# Patient Record
Sex: Male | Born: 1945 | Race: White | Hispanic: No | Marital: Married | Smoking: Former smoker
Health system: Southern US, Community
[De-identification: ages and names within clinical notes are randomized; demographics above are authoritative.]

## PROBLEM LIST (undated history)

## (undated) DIAGNOSIS — G459 Transient cerebral ischemic attack, unspecified: Secondary | ICD-10-CM

## (undated) DIAGNOSIS — I639 Cerebral infarction, unspecified: Secondary | ICD-10-CM

## (undated) DIAGNOSIS — J90 Pleural effusion, not elsewhere classified: Secondary | ICD-10-CM

## (undated) DIAGNOSIS — C8190 Hodgkin lymphoma, unspecified, unspecified site: Secondary | ICD-10-CM

## (undated) DIAGNOSIS — I519 Heart disease, unspecified: Secondary | ICD-10-CM

## (undated) DIAGNOSIS — I059 Rheumatic mitral valve disease, unspecified: Secondary | ICD-10-CM

## (undated) HISTORY — DX: Hodgkin lymphoma, unspecified, unspecified site: C81.90

## (undated) HISTORY — PX: FOOT SURGERY: SHX648

## (undated) HISTORY — DX: Cerebral infarction, unspecified: I63.9

## (undated) HISTORY — PX: SPINE SURGERY: SHX786

## (undated) HISTORY — DX: Pleural effusion, not elsewhere classified: J90

## (undated) HISTORY — DX: Transient cerebral ischemic attack, unspecified: G45.9

## (undated) HISTORY — DX: Heart disease, unspecified: I51.9

## (undated) HISTORY — DX: Rheumatic mitral valve disease, unspecified: I05.9

---

## 2002-06-17 ENCOUNTER — Ambulatory Visit (HOSPITAL_COMMUNITY): Admission: RE | Admit: 2002-06-17 | Discharge: 2002-06-17 | Payer: Self-pay | Admitting: Neurosurgery

## 2002-06-17 ENCOUNTER — Encounter: Payer: Self-pay | Admitting: Neurosurgery

## 2006-05-22 ENCOUNTER — Ambulatory Visit: Payer: Self-pay | Admitting: Unknown Physician Specialty

## 2008-03-21 DIAGNOSIS — D229 Melanocytic nevi, unspecified: Secondary | ICD-10-CM

## 2008-03-21 HISTORY — DX: Melanocytic nevi, unspecified: D22.9

## 2008-08-12 ENCOUNTER — Ambulatory Visit: Payer: Self-pay | Admitting: Family Medicine

## 2009-07-19 ENCOUNTER — Ambulatory Visit: Payer: Self-pay | Admitting: Unknown Physician Specialty

## 2009-07-19 LAB — HM COLONOSCOPY

## 2010-04-29 ENCOUNTER — Encounter: Payer: Self-pay | Admitting: Specialist

## 2011-08-07 ENCOUNTER — Ambulatory Visit: Payer: Self-pay | Admitting: Oncology

## 2011-08-15 ENCOUNTER — Ambulatory Visit: Payer: Self-pay | Admitting: Family Medicine

## 2011-08-16 ENCOUNTER — Encounter (INDEPENDENT_AMBULATORY_CARE_PROVIDER_SITE_OTHER): Payer: Medicare Other | Admitting: Ophthalmology

## 2011-08-16 ENCOUNTER — Emergency Department: Payer: Self-pay | Admitting: Emergency Medicine

## 2011-08-16 DIAGNOSIS — H34239 Retinal artery branch occlusion, unspecified eye: Secondary | ICD-10-CM

## 2011-08-16 DIAGNOSIS — I10 Essential (primary) hypertension: Secondary | ICD-10-CM

## 2011-08-16 DIAGNOSIS — H2510 Age-related nuclear cataract, unspecified eye: Secondary | ICD-10-CM

## 2011-08-16 DIAGNOSIS — H35039 Hypertensive retinopathy, unspecified eye: Secondary | ICD-10-CM

## 2011-08-16 DIAGNOSIS — H43819 Vitreous degeneration, unspecified eye: Secondary | ICD-10-CM

## 2011-08-19 ENCOUNTER — Inpatient Hospital Stay: Payer: Self-pay | Admitting: Cardiology

## 2011-08-19 LAB — CBC WITH DIFFERENTIAL/PLATELET
Basophil #: 0.1 10*3/uL (ref 0.0–0.1)
Basophil %: 0.8 %
Eosinophil #: 0.1 10*3/uL (ref 0.0–0.7)
Eosinophil %: 1.9 %
MCV: 94 fL (ref 80–100)
Monocyte #: 0.7 x10 3/mm (ref 0.2–1.0)
Neutrophil #: 5.1 10*3/uL (ref 1.4–6.5)
Neutrophil %: 67.2 %
Platelet: 223 10*3/uL (ref 150–440)
RBC: 4.51 10*6/uL (ref 4.40–5.90)

## 2011-08-19 LAB — BASIC METABOLIC PANEL
Anion Gap: 5 — ABNORMAL LOW (ref 7–16)
Chloride: 104 mmol/L (ref 98–107)
Creatinine: 0.7 mg/dL (ref 0.60–1.30)
EGFR (Non-African Amer.): >60
Glucose: 105 mg/dL — ABNORMAL HIGH (ref 65–99)

## 2011-08-19 LAB — PROTIME-INR
INR: 1
Prothrombin Time: 13.3 secs (ref 11.5–14.7)

## 2011-08-20 LAB — PROTIME-INR: INR: 1

## 2011-08-20 LAB — APTT: Activated PTT: 81.6 secs — ABNORMAL HIGH (ref 23.6–35.9)

## 2011-08-21 LAB — APTT
Activated PTT: 135.5 secs — ABNORMAL HIGH (ref 23.6–35.9)
Activated PTT: 106.4 secs — ABNORMAL HIGH (ref 23.6–35.9)

## 2011-08-21 LAB — PLATELET COUNT: Platelet: 207 10*3/uL (ref 150–440)

## 2011-08-21 LAB — PROTIME-INR: Prothrombin Time: 15.9 secs — ABNORMAL HIGH (ref 11.5–14.7)

## 2011-08-22 LAB — APTT: Activated PTT: 116.5 secs — ABNORMAL HIGH (ref 23.6–35.9)

## 2011-08-23 LAB — PROTIME-INR
INR: 2.3
Prothrombin Time: 25.8 secs — ABNORMAL HIGH (ref 11.5–14.7)

## 2011-08-23 LAB — PLATELET COUNT: Platelet: 186 10*3/uL (ref 150–440)

## 2011-08-23 LAB — APTT: Activated PTT: 113.9 secs — ABNORMAL HIGH (ref 23.6–35.9)

## 2011-09-07 ENCOUNTER — Ambulatory Visit: Payer: Self-pay | Admitting: Oncology

## 2011-09-16 ENCOUNTER — Encounter (INDEPENDENT_AMBULATORY_CARE_PROVIDER_SITE_OTHER): Payer: Medicare Other | Admitting: Ophthalmology

## 2011-09-16 DIAGNOSIS — H43819 Vitreous degeneration, unspecified eye: Secondary | ICD-10-CM

## 2011-09-16 DIAGNOSIS — H2510 Age-related nuclear cataract, unspecified eye: Secondary | ICD-10-CM

## 2011-09-16 DIAGNOSIS — H35039 Hypertensive retinopathy, unspecified eye: Secondary | ICD-10-CM

## 2011-09-16 DIAGNOSIS — H34239 Retinal artery branch occlusion, unspecified eye: Secondary | ICD-10-CM

## 2011-09-16 DIAGNOSIS — I10 Essential (primary) hypertension: Secondary | ICD-10-CM

## 2011-09-16 DIAGNOSIS — E11319 Type 2 diabetes mellitus with unspecified diabetic retinopathy without macular edema: Secondary | ICD-10-CM

## 2011-11-21 ENCOUNTER — Emergency Department: Payer: Self-pay | Admitting: Emergency Medicine

## 2011-11-21 ENCOUNTER — Ambulatory Visit: Payer: Self-pay | Admitting: Family Medicine

## 2011-11-21 LAB — COMPREHENSIVE METABOLIC PANEL
Albumin: 3.1 g/dL — ABNORMAL LOW (ref 3.4–5.0)
Alkaline Phosphatase: 72 U/L (ref 50–136)
BUN: 27 mg/dL — ABNORMAL HIGH (ref 7–18)
Calcium, Total: 8.9 mg/dL (ref 8.5–10.1)
Creatinine: 1.21 mg/dL (ref 0.60–1.30)
EGFR (African American): >60
EGFR (Non-African Amer.): >60
Osmolality: 283 (ref 275–301)
Sodium: 138 mmol/L (ref 136–145)

## 2011-11-21 LAB — CBC
HCT: 41.5 % (ref 40.0–52.0)
HGB: 13.2 g/dL (ref 13.0–18.0)
MCH: 29.8 pg (ref 26.0–34.0)
Platelet: 225 10*3/uL (ref 150–440)
RBC: 4.45 10*6/uL (ref 4.40–5.90)

## 2011-11-26 ENCOUNTER — Ambulatory Visit: Payer: Self-pay | Admitting: Family Medicine

## 2011-12-04 ENCOUNTER — Ambulatory Visit: Payer: Self-pay | Admitting: Family Medicine

## 2011-12-11 ENCOUNTER — Encounter (INDEPENDENT_AMBULATORY_CARE_PROVIDER_SITE_OTHER): Payer: Medicare Other | Admitting: Ophthalmology

## 2011-12-11 DIAGNOSIS — I10 Essential (primary) hypertension: Secondary | ICD-10-CM

## 2011-12-11 DIAGNOSIS — H2510 Age-related nuclear cataract, unspecified eye: Secondary | ICD-10-CM

## 2011-12-11 DIAGNOSIS — H34239 Retinal artery branch occlusion, unspecified eye: Secondary | ICD-10-CM

## 2011-12-11 DIAGNOSIS — E1139 Type 2 diabetes mellitus with other diabetic ophthalmic complication: Secondary | ICD-10-CM

## 2011-12-11 DIAGNOSIS — E11319 Type 2 diabetes mellitus with unspecified diabetic retinopathy without macular edema: Secondary | ICD-10-CM

## 2011-12-11 DIAGNOSIS — H43819 Vitreous degeneration, unspecified eye: Secondary | ICD-10-CM

## 2011-12-11 DIAGNOSIS — H35039 Hypertensive retinopathy, unspecified eye: Secondary | ICD-10-CM

## 2011-12-11 DIAGNOSIS — E1165 Type 2 diabetes mellitus with hyperglycemia: Secondary | ICD-10-CM

## 2011-12-12 ENCOUNTER — Ambulatory Visit: Payer: Self-pay | Admitting: Family Medicine

## 2011-12-13 ENCOUNTER — Ambulatory Visit (INDEPENDENT_AMBULATORY_CARE_PROVIDER_SITE_OTHER): Payer: Medicare Other | Admitting: Pulmonary Disease

## 2011-12-13 ENCOUNTER — Encounter: Payer: Self-pay | Admitting: Pulmonary Disease

## 2011-12-13 VITALS — BP 108/58 | HR 100 | Temp 98.1°F | Ht 68.0 in | Wt 167.0 lb

## 2011-12-13 DIAGNOSIS — I059 Rheumatic mitral valve disease, unspecified: Secondary | ICD-10-CM | POA: Insufficient documentation

## 2011-12-13 DIAGNOSIS — I519 Heart disease, unspecified: Secondary | ICD-10-CM

## 2011-12-13 DIAGNOSIS — R042 Hemoptysis: Secondary | ICD-10-CM

## 2011-12-13 DIAGNOSIS — J90 Pleural effusion, not elsewhere classified: Secondary | ICD-10-CM | POA: Insufficient documentation

## 2011-12-13 NOTE — Assessment & Plan Note (Signed)
By history I would say that it's most likely a transudate which related to CHF given the fact that it improved dramatically with diuresis.  Plan: -We'll continue diuresis at its current dose. -We'll get a better look at the pleural effusion next week with a CT scan. I doubt that they'll be an indication for performing a thoracentesis unless there is a significant amount left.

## 2011-12-13 NOTE — Progress Notes (Signed)
Subjective:    Patient ID: Bill Coleman, male    DOB: Feb 04, 1946, 66 y.o.   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 He had a normal childhood without respiratory problems and lived a normal life until about age 49 when he developed Hodgkin's lymphoma. He was treated with bleomycin for 6 months, not radiation therapy to his chest and steroids for 6 months as well. He has not had recurrence of disease since then has been relatively healthy up until about 3 or 4 months ago. 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 An echocardiogram performed at [facility] showed a mobile aortic valve clot. He was immediately hospitalized and anticoagulated for this. Repeat echocardiogram showed resolution of the clot but then (as I understand it) moderate mitral valve regurg. During that hospitalization he had a pan CT scan performed and a consultation with Dr. Cliffton Asters for a possible paraneoplastic syndrome. There is no clear evidence of malignancy during that time.   Since that hospitalization he struggled quite a bit with leg swelling and shortness of breath. During this time he was found to have bilateral pleural effusions. He was treated with increasing doses of Lasix but eventually he was treated with metolazone which worked well in terms of relieving his shortness of breath and swelling. Today he states he can walk without difficulty or shortness of breath and he says that his ankles look great.  In the last several months he has also experienced hemoptysis since being treated with warfarin. He states that several weeks ago he was coughing up frank blood, it sounds like a tablespoon or less at a time. Intermittently he would produce phlegm and sputum did not have blood in it. In the most recent few weeks  he has been producing a "currant jelly" appearing sputum. This is not associated with chest pain, fever, or weight loss.  Past Medical History  Diagnosis Date  . Bilateral pleural effusion   . Left ventricular dysfunction   . Mini stroke   . Mitral valve disorder   . Hodgkin's disease      Family History  Problem Relation Age of Onset  . Lung cancer Father     was a smoker  . Emphysema Father     was a smoker  . Lung cancer Paternal Grandfather     worked in [employer]  . Heart attack Brother      History   Social History  . Marital Status: Married    Spouse Name: N/A    Number of Children: N/A  . Years of Education: N/A   Occupational History  . Not on file.   Social History Main Topics  . Smoking status: Former Smoker -- 1.0 packs/day for 20 years    Types: Cigarettes, Cigars    Quit date: 04/08/2001  . Smokeless tobacco: Current User    Types: Chew  . Alcohol Use: No  . Drug Use: No  . Sexually Active: Not on file   Other Topics Concern  . Not on file   Social History Narrative  . No narrative on file     No Known Allergies   No outpatient prescriptions prior to visit.      Review of Systems  Constitutional: Negative for fever, chills, activity change and appetite  change.  HENT: Negative for hearing loss, ear pain, congestion, rhinorrhea, sneezing, neck pain, neck stiffness, postnasal drip and sinus pressure.   Eyes: Negative for redness, itching and visual disturbance.  Respiratory: Positive for cough. Negative for chest tightness, shortness of breath and wheezing.   Cardiovascular: Negative for chest pain, palpitations and leg swelling.  Gastrointestinal: Negative for nausea, vomiting, abdominal pain, diarrhea, constipation, blood in stool and abdominal distention.  Musculoskeletal: Negative for myalgias, joint swelling, arthralgias and gait problem.  Skin: Negative for rash.  Neurological: Negative for dizziness, light-headedness, numbness and  headaches.  Hematological: Does not bruise/bleed easily.  Psychiatric/Behavioral: Negative for confusion and dysphoric mood.       Objective:   Physical Exam  Filed Vitals:   12/13/11 1350  BP: 108/58  Pulse: 100  Temp: 98.1 F (36.7 C)  TempSrc: Oral  Height: 5\' 8"  (1.727 m)  Weight: 167 lb (75.751 kg)  SpO2: 98%   Gen: well appearing, no acute distress HEENT: NCAT, PERRL, EOMi, OP clear, neck supple without masses PULM: CTA B CV: RRR, systolic murmur noted, radiates to axilla, no JVD AB: BS+, soft, nontender, no hsm Ext: warm, no edema, no clubbing, no cyanosis Derm: no rash or skin breakdown Neuro: A&Ox4, CN II-XII intact, strength 5/5 in all 4 extremities  Review of 12/12/2011 chest x-ray from [facility]: (My read) bilateral pleural effusions have improved greatly and are nearly gone. There is a hilar opacity on the right side which is more prominent compared to 8 2010 chest x-ray.  Review of may 2013 CT chest abdomen and pelvis at [facility]: (My read) there is a prominent R hilar lymph node lymph node which is less than 2 cm in size, there is no clear parenchymal abnormality in the lungs and there is no pleural effusion.  11/22/2011 simple spirometry performed by [facility]: (Only one acceptable test, performed in setting of bilateral pleural effusions) no clear airway obstruction, FEV1 to FEC ratio is 71%. FEV1 is 1.4 L (45% predicted).    Assessment & Plan:   Hemoptysis I'm quite concerned about the hemoptysis to Bill Coleman has been experiencing in the last several weeks. It certainly sounds like it is exacerbated when his PT level was elevated a week or 2 ago but the fact that it has persisted for several months now is quite concerning. Typically hemoptysis is not due to an elevated PT level alone is usually there is some sort of a mucosal lesion that leads to the bleeding.  In his case I think the differential diagnosis for his hemoptysis would be possibly  increased bronchial vessel pressure are related to his mitral valve disease versus less likely an airway lesion. The former would be a diagnosis of exclusion and given his personal history of malignancy I think it is very important to evaluate for narrowing lesion. I am also concerned that his chest x-ray has a rounded shadow in the right hilum is more prominent than seen on a 2010 chest x-ray.  Plan: -Perform a CT scan of his chest without contrast next week to evaluate for hilar mass -Continue to keep INR therapeutic as directed by his cardiologist. -I will contact Dr. Darrold Junker next week after his office visit with Bill Coleman and we'll discuss how to deal with his anticoagulation in the event that he needs a bronchoscopy.  Bilateral pleural effusion By history I would say that it's most likely a transudate which related to CHF given the fact that it improved dramatically with diuresis.  Plan: -We'll continue diuresis at its current dose. -We'll get a better look at the pleural effusion next week with a CT scan. I doubt that they'll be an indication for performing a thoracentesis unless there is a significant amount left.  Left ventricular dysfunction We will need to obtain records from Dr. Darrold Junker office regarding this.   Updated Medication List Outpatient Encounter Prescriptions as of 12/13/2011  Medication Sig Dispense Refill  . Aclidinium Bromide (TUDORZA PRESSAIR) 400 MCG/ACT AEPB Inhale 1 puff into the lungs 2 (two) times daily.      Marland Kitchen ALPRAZolam (XANAX) 0.5 MG tablet Take 1 mg by mouth at bedtime as needed.      Marland Kitchen aspirin 81 MG tablet Take 81 mg by mouth daily.      . furosemide (LASIX) 40 MG tablet Take 40 mg by mouth 2 (two) times daily.      Marland Kitchen levothyroxine (SYNTHROID, LEVOTHROID) 25 MCG tablet Take 25 mcg by mouth daily.      Marland Kitchen losartan (COZAAR) 25 MG tablet Take 25 mg by mouth daily.      . metolazone (ZAROXOLYN) 2.5 MG tablet Take 2.5 mg by mouth daily.      . Mometasone  Furo-Formoterol Fum (DULERA) 200-5 MCG/ACT AERO Inhale 1 puff into the lungs 2 (two) times daily.      . Multiple Vitamin (MULTIVITAMIN) capsule Take 1 capsule by mouth daily.      . Omega-3 Fatty Acids (FISH OIL) 1000 MG CAPS Take 1 capsule by mouth daily.      . potassium chloride (MICRO-K) 10 MEQ CR capsule Take 40 mEq by mouth daily.      Marland Kitchen warfarin (COUMADIN) 4 MG tablet Take 4 mg by mouth daily.

## 2011-12-13 NOTE — Assessment & Plan Note (Signed)
I'm quite concerned about the hemoptysis to Bill Coleman has been experiencing in the last several weeks. It certainly sounds like it is exacerbated when his PT level was elevated a week or 2 ago but the fact that it has persisted for several months now is quite concerning. Typically hemoptysis is not due to an elevated PT level alone is usually there is some sort of a mucosal lesion that leads to the bleeding.  In his case I think the differential diagnosis for his hemoptysis would be possibly increased bronchial vessel pressure are related to his mitral valve disease versus less likely an airway lesion. The former would be a diagnosis of exclusion and given his personal history of malignancy I think it is very important to evaluate for narrowing lesion. I am also concerned that his chest x-ray has a rounded shadow in the right hilum is more prominent than seen on a 2010 chest x-ray.  Plan: -Perform a CT scan of his chest without contrast next week to evaluate for hilar mass -Continue to keep INR therapeutic as directed by his cardiologist. -I will contact Dr. Darrold Junker next week after his office visit with Bill Coleman and we'll discuss how to deal with his anticoagulation in the event that he needs a bronchoscopy.

## 2011-12-13 NOTE — Assessment & Plan Note (Signed)
We will need to obtain records from Dr. Darrold Junker office regarding this.

## 2011-12-13 NOTE — Patient Instructions (Signed)
We will have you get a CT scan of your chest next Wednesday at [facility] (hopefully the outpatient radiology center) I will contact Dr. Silas Flood next week after you have seen him. We will see you back in 2-3 weeks overbook OK

## 2011-12-18 ENCOUNTER — Ambulatory Visit: Payer: Self-pay | Admitting: Pulmonary Disease

## 2011-12-18 ENCOUNTER — Encounter (INDEPENDENT_AMBULATORY_CARE_PROVIDER_SITE_OTHER): Payer: Medicare Other | Admitting: Ophthalmology

## 2012-01-06 ENCOUNTER — Ambulatory Visit (INDEPENDENT_AMBULATORY_CARE_PROVIDER_SITE_OTHER): Payer: Medicare Other | Admitting: Pulmonary Disease

## 2012-01-06 ENCOUNTER — Encounter: Payer: Self-pay | Admitting: Pulmonary Disease

## 2012-01-06 VITALS — BP 100/60 | HR 97 | Temp 97.5°F | Ht 68.0 in | Wt 161.8 lb

## 2012-01-06 DIAGNOSIS — R042 Hemoptysis: Secondary | ICD-10-CM

## 2012-01-06 DIAGNOSIS — R05 Cough: Secondary | ICD-10-CM

## 2012-01-06 DIAGNOSIS — R059 Cough, unspecified: Secondary | ICD-10-CM

## 2012-01-06 DIAGNOSIS — R058 Other specified cough: Secondary | ICD-10-CM | POA: Insufficient documentation

## 2012-01-06 NOTE — Patient Instructions (Signed)
We will see you back in 3 months If you notice coughing up any blood let us know immediately If you have shortness of breath with your exercise routine let us know and we will order spirometry.

## 2012-01-06 NOTE — Assessment & Plan Note (Signed)
He describes several months of sputum production. As noted in the prior visit it was transiently purplish in color after his most recent hospitalization but this is resolved. He still produces clear sputum on a regular basis. I explained to him that this is most likely chronic bronchitis and that he should be screened for COPD. However he refused spirometry today stating he would rather not undergo anymore testing given the extensive testing had lately.  Plan: -Obtain spirometry on the next visit to look for COPD if he is willing

## 2012-01-06 NOTE — Progress Notes (Signed)
Subjective:    Patient ID: Bill Coleman, male    DOB: 1945-06-26, 66 y.o.   MRN: [id number]  Synopsis: Bill Coleman was first evaluated by the  pulmonary clinic in September 2013 for right pleural effusion and hemoptysis. He had Hodgkin's disease diagnosed in his 77s which was treated successfully at the [facility]. 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 He was sent to [place] for evaluation of the pleural effusions and hemoptysis. By the time he saw [place] in September 2013 his symptoms are greatly improved with adequate diuresis.  HPI  01/06/12 ROV- Bill Coleman states it is doing great. He is no longer feeling short of breath and he has not had any "purple sputum" production since 2 days after her last visit. His weight is stable he has no chest pain fevers or chills. He's been exercising on a regular basis and he says that his appetite is up. He is starting to work out with a Systems analyst on a daily basis.  He had a CT scan done on 12/18/2011 at [facility] which showed resolution of the right lower lobe pleural effusion.   Past Medical History  Diagnosis Date  . Bilateral pleural effusion   . Left ventricular dysfunction   . Mini stroke   . Mitral valve disorder   . Hodgkin's disease      Review of Systems Gen: no fevers, no chills, no weight change CV: no chest pain, no new leg swelling    Objective:   Physical Exam  Filed Vitals:   01/06/12 1344  BP: 100/60  Pulse: 97  Temp: 97.5 F (36.4 C)  TempSrc: Oral  Height: 5\' 8"  (1.727 m)  Weight: 161 lb 12.8 oz (73.392 kg)  SpO2: 97%   Gen: well appearing, no acute distress HEENT: NCAT, PERRL, EOMi, OP clear, neck supple without masses PULM: CTA B CV: RRR, diastolic murmur noted, no JVD AB:  BS+, soft, nontender, no hsm Ext: warm, no edema, no clubbing, no cyanosis      Assessment & Plan:   Hemoptysis Bill Coleman has had complete resolution of the purple colored sputum he was producing on her last visit. He says that this stopped about 2 days after her last visit and he hasn't had any recurrence since. He hasn't had hematemesis or epistaxis either. He is feeling great his weight is stable, and he is beginning to exercise again regularly.  Plan:  -based on his clinical stability and lack of further "purple colored" sputum production we will hold off on performing an endoscopy/bronchoscopy at this time. -He is instructed to call us immediately should he develop hemoptysis or change in sputum color.  Productive cough He describes several months of sputum production. As noted in the prior visit it was transiently purplish in color after his most recent hospitalization but this is resolved. He still produces clear sputum on a regular basis. I explained to him that this is most likely chronic bronchitis and that he should be screened for COPD. However he refused spirometry today stating he would rather not undergo anymore testing given the extensive testing had lately.  Plan: -Obtain spirometry on the next visit to look for COPD if he is willing   Updated Medication List Outpatient Encounter Prescriptions as of 01/06/2012  Medication Sig Dispense  Refill  . ALPRAZolam (XANAX) 0.5 MG tablet Take 1 mg by mouth at bedtime as needed.      Marland Kitchen aspirin 81 MG tablet Take 81 mg by mouth daily.      . carvedilol (COREG) 3.125 MG tablet Take 3.125 mg by mouth 2 (two) times daily with a meal.      . furosemide (LASIX) 40 MG tablet Take 40 mg by mouth 2 (two) times daily.      Marland Kitchen levothyroxine (SYNTHROID, LEVOTHROID) 25 MCG tablet Take 25 mcg by mouth daily.      . metolazone (ZAROXOLYN) 2.5 MG tablet Take 2.5 mg by mouth daily.      . Multiple Vitamin (MULTIVITAMIN) capsule Take 1 capsule by mouth  daily.      . Omega-3 Fatty Acids (FISH OIL) 1000 MG CAPS Take 1 capsule by mouth daily.      . potassium chloride (MICRO-K) 10 MEQ CR capsule Take 40 mEq by mouth daily.      Marland Kitchen warfarin (COUMADIN) 4 MG tablet Take 4 mg by mouth daily.      Marland Kitchen DISCONTD: Aclidinium Bromide (TUDORZA PRESSAIR) 400 MCG/ACT AEPB Inhale 1 puff into the lungs 2 (two) times daily.      Marland Kitchen DISCONTD: losartan (COZAAR) 25 MG tablet Take 25 mg by mouth daily.      Marland Kitchen DISCONTD: Mometasone Furo-Formoterol Fum (DULERA) 200-5 MCG/ACT AERO Inhale 1 puff into the lungs 2 (two) times daily.

## 2012-01-06 NOTE — Assessment & Plan Note (Signed)
Bill Coleman has had complete resolution of the purple colored sputum he was producing on her last visit. He says that this stopped about 2 days after her last visit and he hasn't had any recurrence since. He hasn't had hematemesis or epistaxis either. He is feeling great his weight is stable, and he is beginning to exercise again regularly.  Plan:  -based on his clinical stability and lack of further "purple colored" sputum production we will hold off on performing an endoscopy/bronchoscopy at this time. -He is instructed to call us immediately should he develop hemoptysis or change in sputum color.

## 2012-01-15 ENCOUNTER — Encounter: Payer: Self-pay | Admitting: Pulmonary Disease

## 2012-02-03 ENCOUNTER — Ambulatory Visit: Payer: Medicare Other | Admitting: Pulmonary Disease

## 2012-02-10 ENCOUNTER — Encounter (INDEPENDENT_AMBULATORY_CARE_PROVIDER_SITE_OTHER): Payer: Medicare Other | Admitting: Ophthalmology

## 2012-07-10 DIAGNOSIS — Z86018 Personal history of other benign neoplasm: Secondary | ICD-10-CM

## 2012-07-10 HISTORY — DX: Personal history of other benign neoplasm: Z86.018

## 2013-01-02 IMAGING — CT CT CHEST-ABD-PELV W/ CM
1 of 3 series · 14 of 31 positions shown, 19 images · IV contrast (isovue)
Comparison: none

REASON FOR EXAM: (1) aortic value thrombus, assess for malignancy; (2)
aortic value thrombus, ass
COMMENTS:

PROCEDURE:     CT  - CT CHEST ABDOMEN AND PELVIS W  - August 20, 2011  [DATE]
RESULT:     Comparison: None
TECHNIQUE: Multiple axial images obtained from the thoracic inlet to the
pubic symphysis, with p.o. contrast and with 100 ml of Isovue- 300
intravenous contrast.

[Series 2: soft tissue · axial · 0.76mm/px · z∈[-668,-116]mm · 14 of 214 slices shown, 19 images]
[im 15/214  mediastinal]
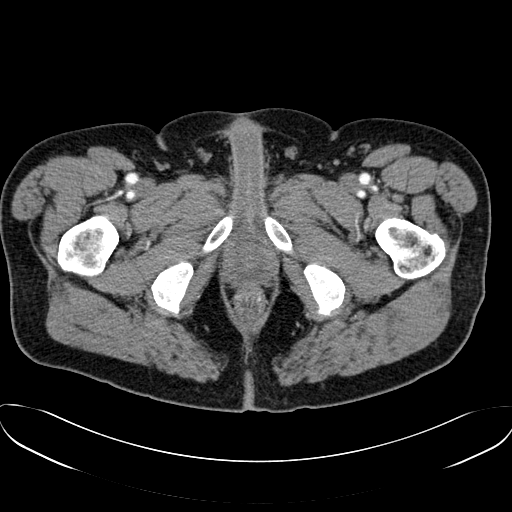
[im 15/214  bone]
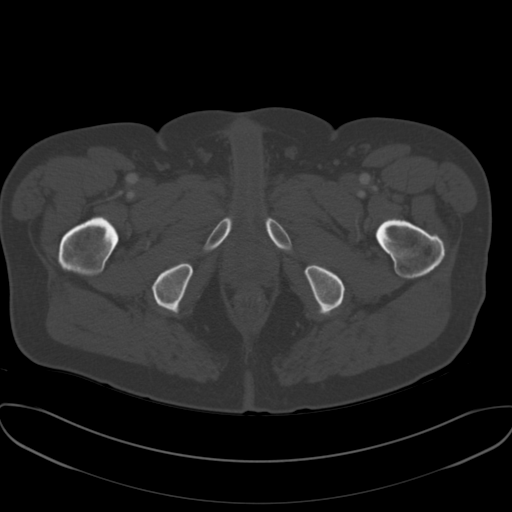
[im 29/214  mediastinal]
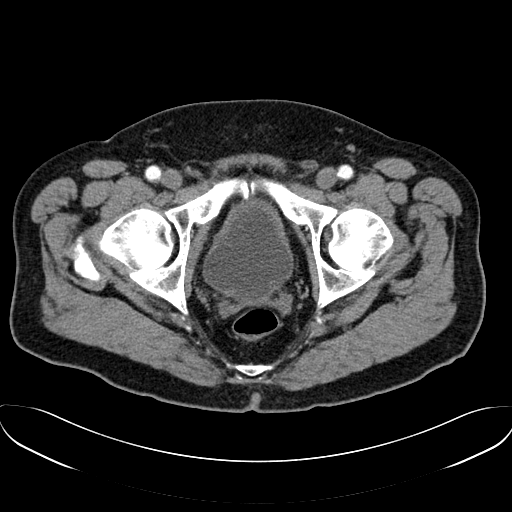
[im 57/214  mediastinal]
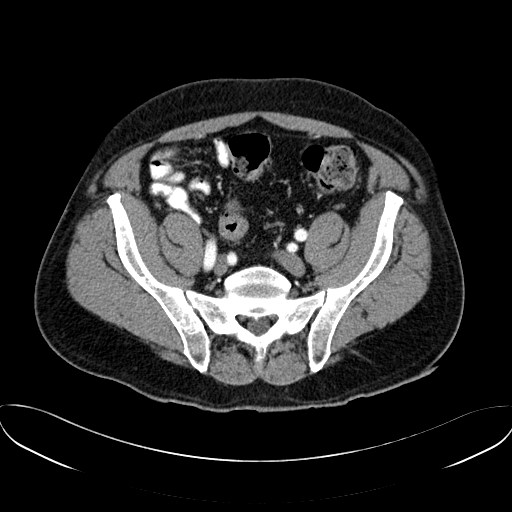
[im 72/214  mediastinal]
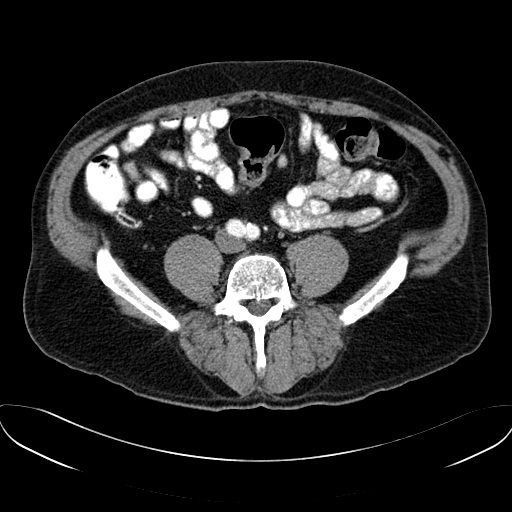
[im 86/214  mediastinal]
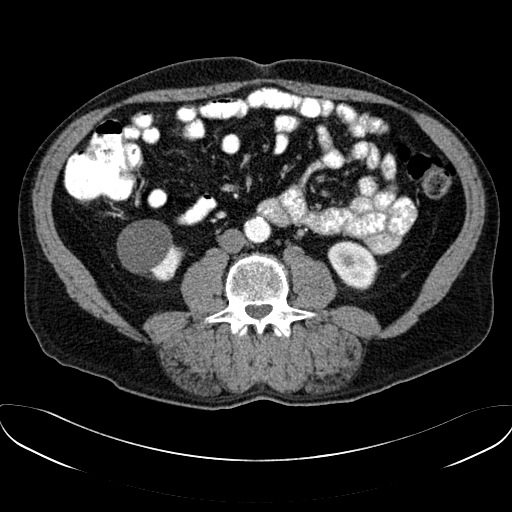
[im 100/214  mediastinal]
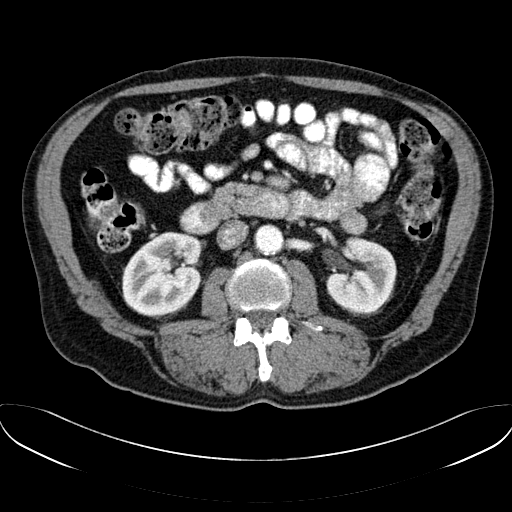
[im 107/214  mediastinal]
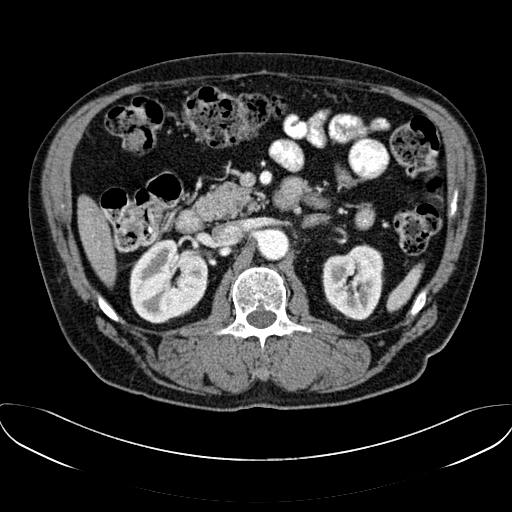
[im 114/214  mediastinal]
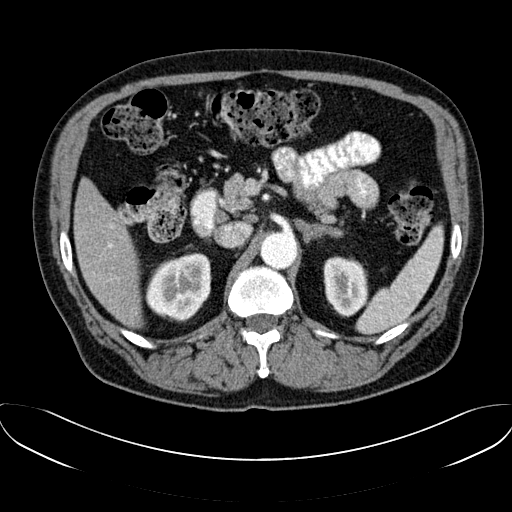
[im 128/214  mediastinal]
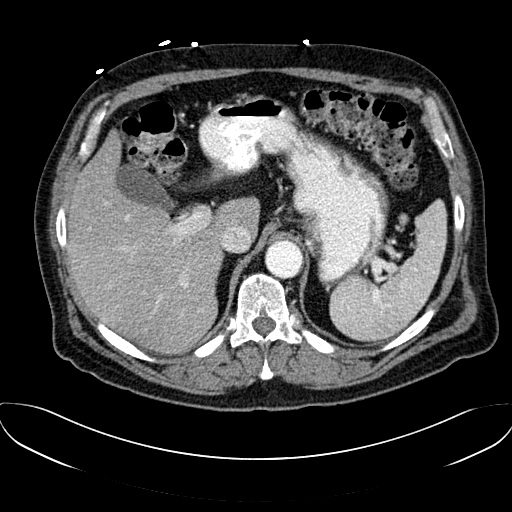
[im 128/214  bone]
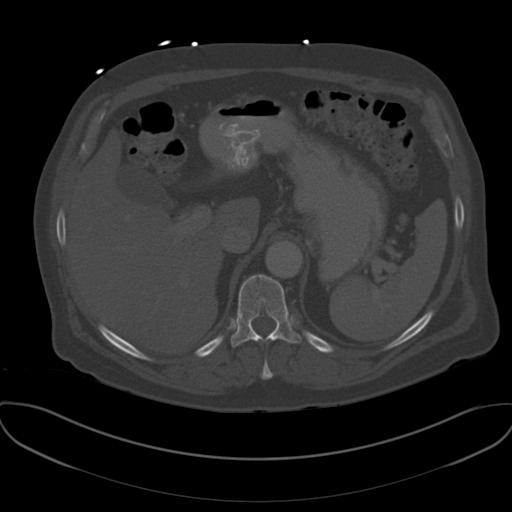
[im 143/214  mediastinal]
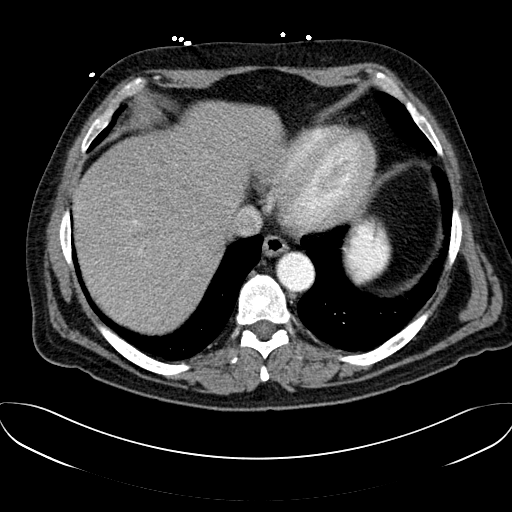
[im 157/214  mediastinal]
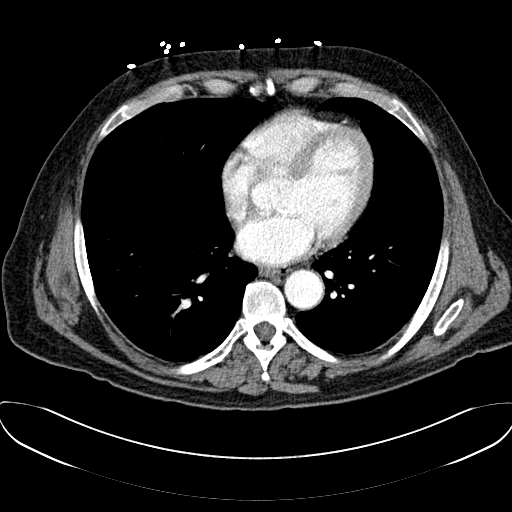
[im 157/214  lung]
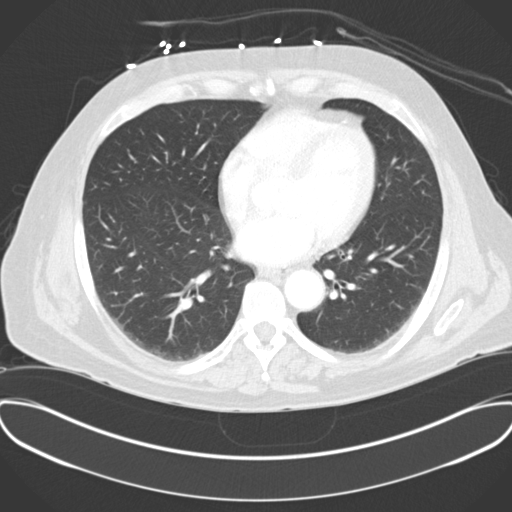
[im 171/214  lung]
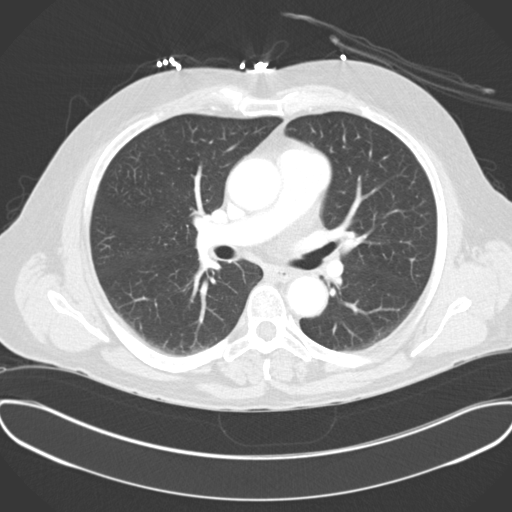
[im 185/214  mediastinal]
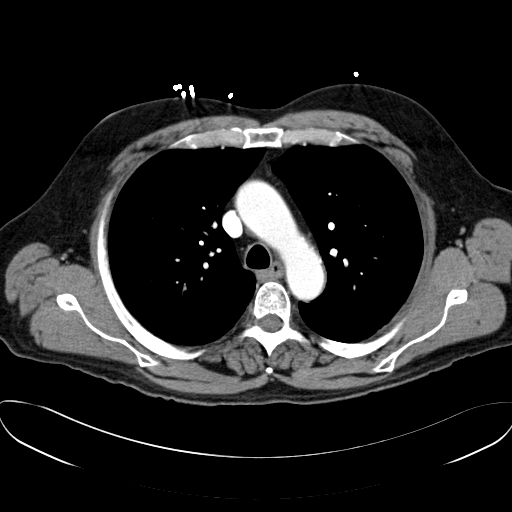
[im 185/214  lung]
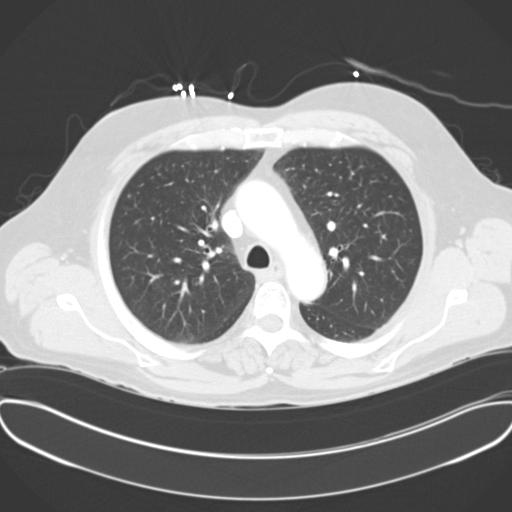
[im 199/214  mediastinal]
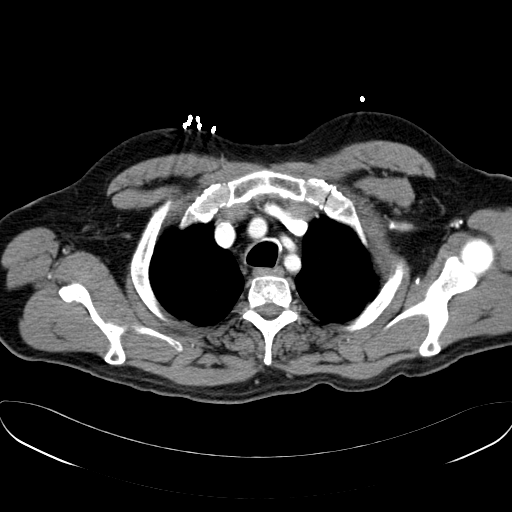
[im 199/214  lung]
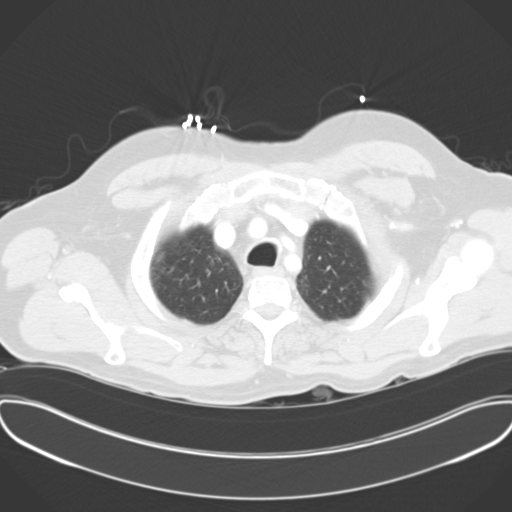

[14 of 31 positions shown; findings below may reference images not displayed]

FINDINGS: No mediastinal, hilar, or axillary lymphadenopathy. Mild soft tissue
thickening at the gastroesophageal junction is likely secondary to
underdistention. There is a 3 mm nodule in the right upper lobe, image 31.
There is a 3 mm nodule in the left upper lobe, image 31.

The liver, spleen, pancreas, and gallbladder are unremarkable. Exophytic
low-attenuation lesion in the kidney is consistent with a cyst. There is
mild nodular thickening of the adrenal glands, left greater than right,
without definite mass. The small and large bowel are normal in caliber.
There is mild diverticulosis of the sigmoid colon. The appendix is normal.
Small round sclerotic densities in the right iliac wing likely represent
bone islands. Small focus of sclerosis in the right posterior fourth rib is
likely related to a bone island.
IMPRESSION: 1. Mild soft tissue thickening at the gastroesophageal junction is likely
secondary to underdistention. However, malignancy would be difficult to
exclude. Further evaluation could be provided with endoscopy.
2. Small foci of sclerosis in the right posterior fourth rib and the right
iliac wing are likely secondary to bone islands. However, if there is
concern for metastatic disease, a nuclear medicine bone scan could be
performed.
3. Indeterminate pulmonary nodules which are 3 mm in size. If the patient is
at low risk for lung cancer, no further follow-up is recommended.  If the
patient is at high risk for lung cancer, recommend 12 month follow-up
noncontrast chest CT.

## 2013-04-18 IMAGING — US ABDOMEN ULTRASOUND
1 series · 13 of 25 positions shown · non-contrast
Comparison: none

REASON FOR EXAM: Ascites  Eval  Liver
COMMENTS:

[Series 1: abdomen ultrasound · 0.26mm/px · 13 of 116 slices shown]
[im 1/116]
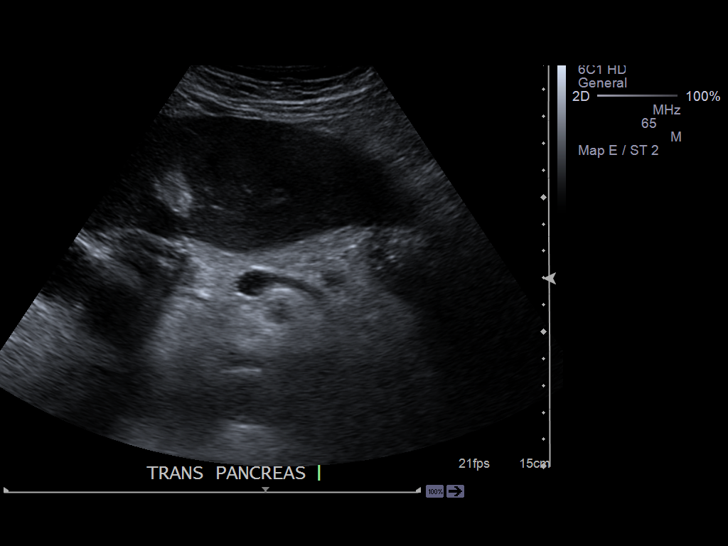
[im 10/116]
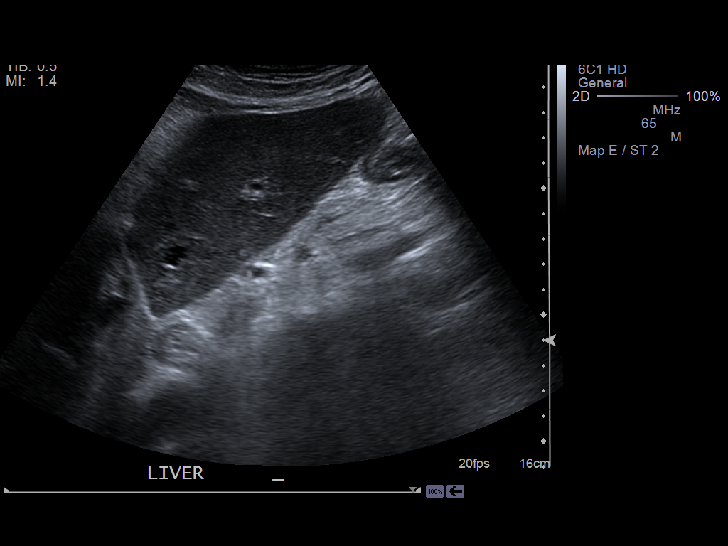
[im 20/116]
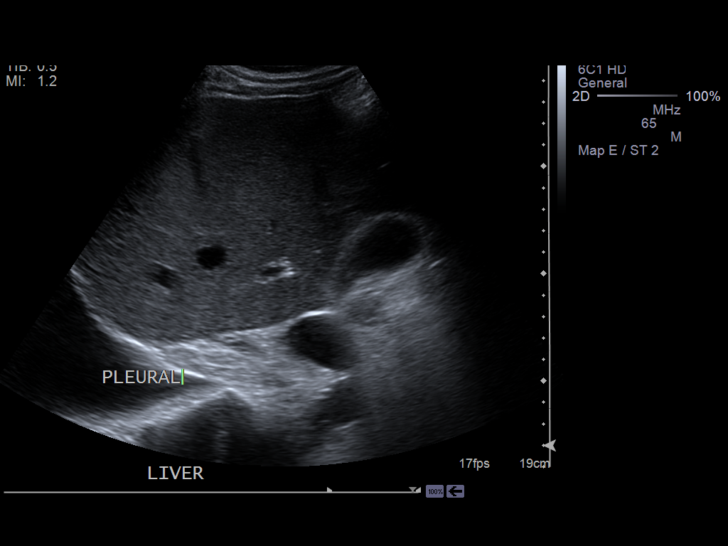
[im 29/116]
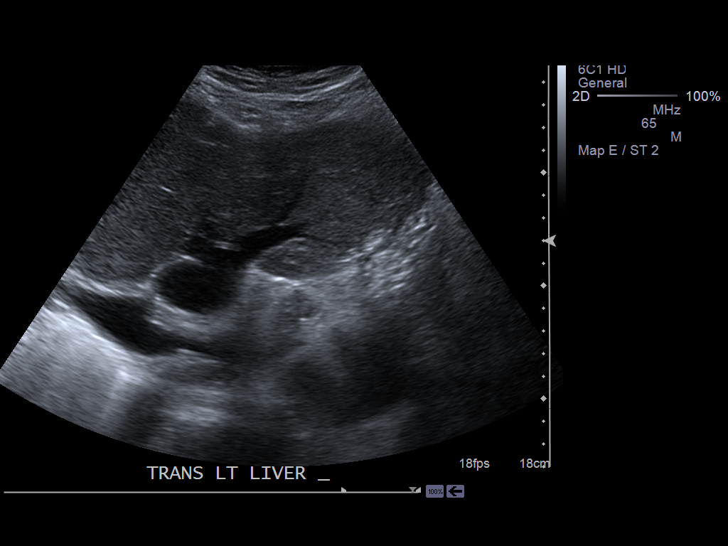
[im 39/116]
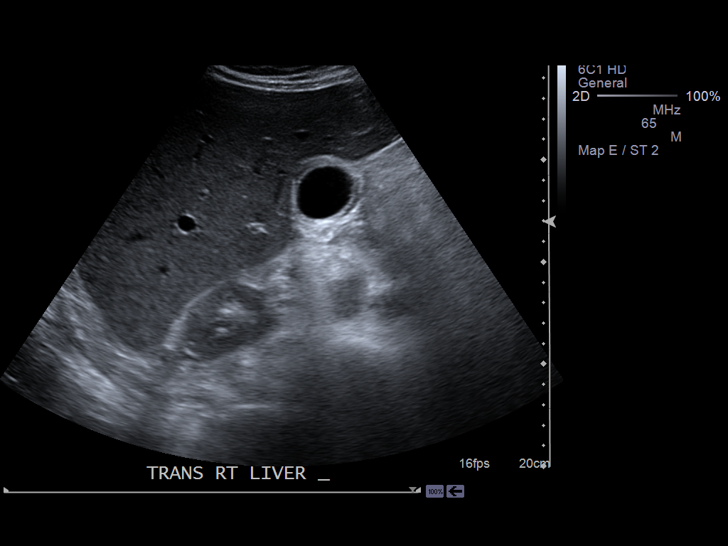
[im 48/116]
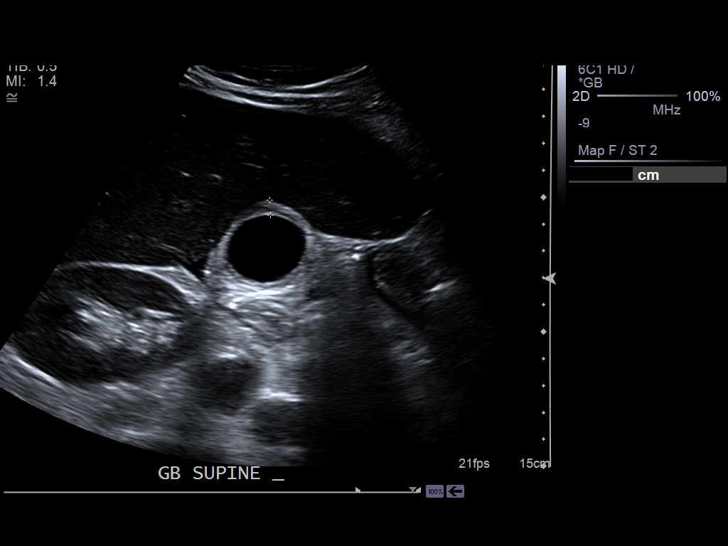
[im 58/116]
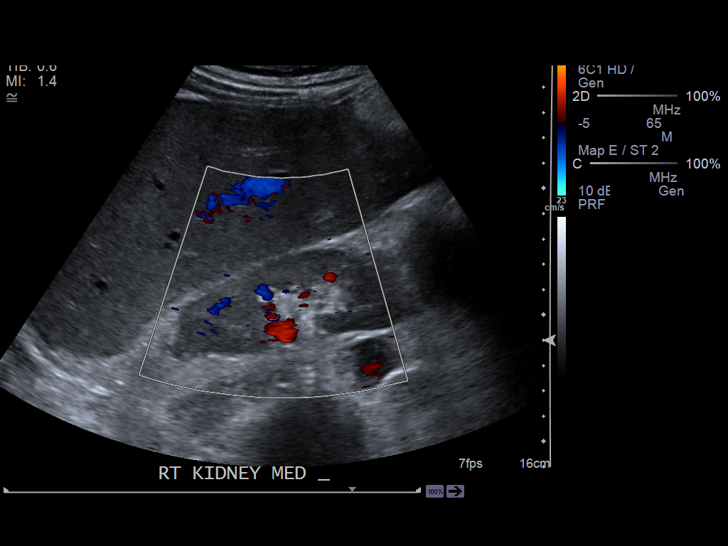
[im 68/116]
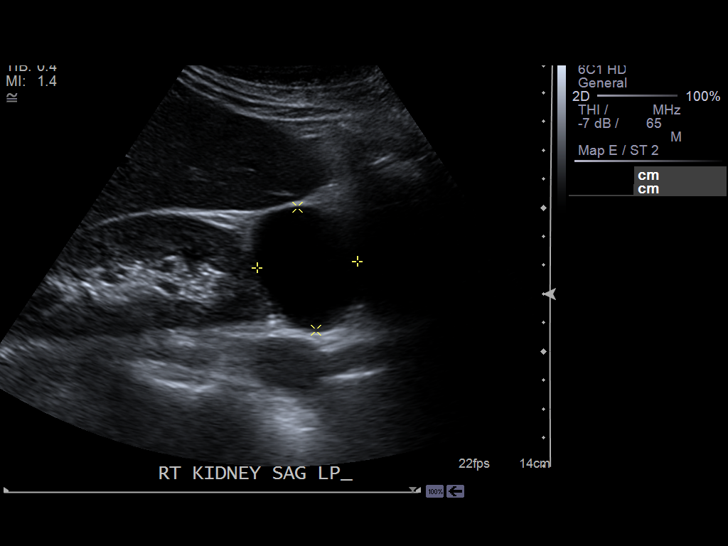
[im 77/116]
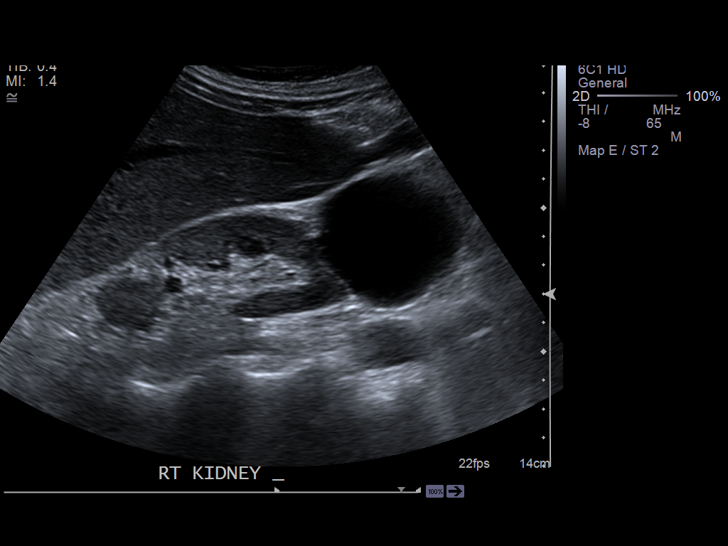
[im 87/116]
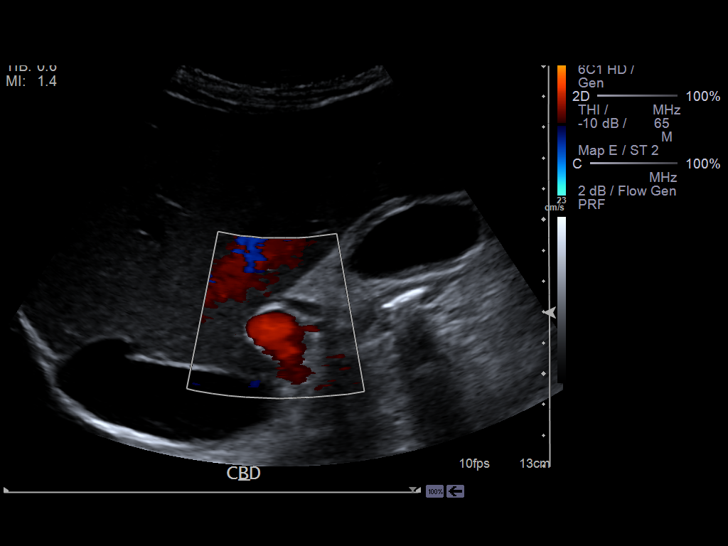
[im 96/116]
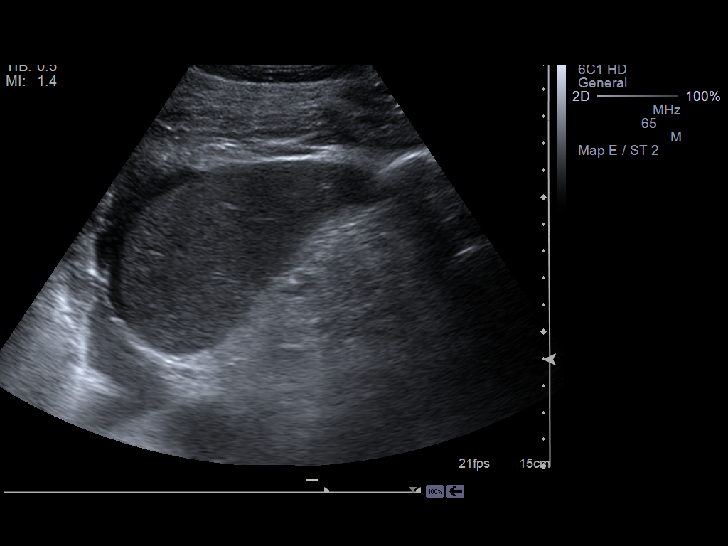
[im 106/116]
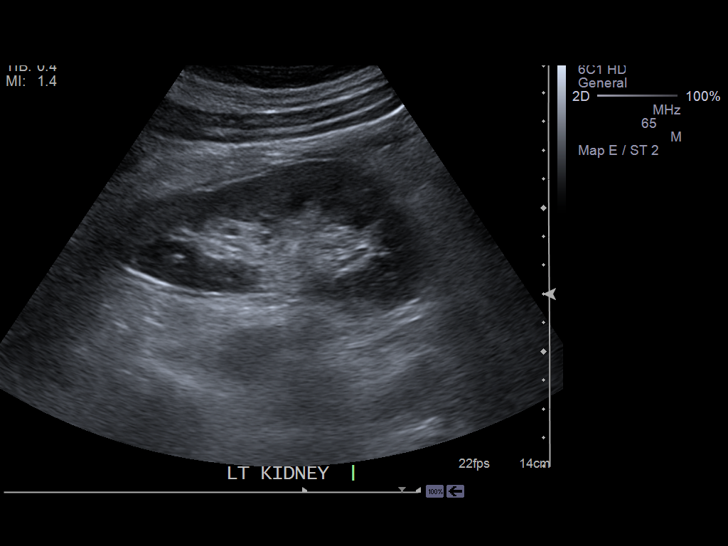
[im 116/116]
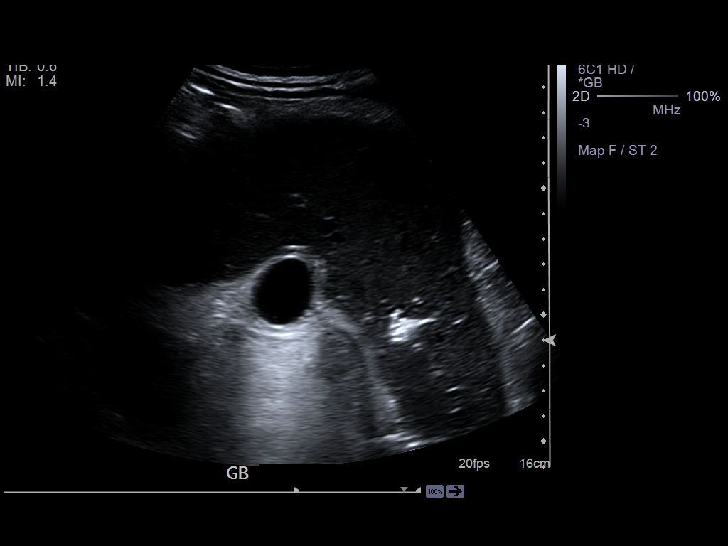

[13 of 25 positions shown; findings below may reference images not displayed]

PROCEDURE:     US  - US ABDOMEN GENERAL SURVEY  - December 04, 2011 [DATE]

RESULT:     Abdominal sonogram demonstrates a large amount of fluid in the
right pleural space sonographically. This was not completely assessed. There
are pockets of ascites in the upper abdominal regions adjacent to the liver
and spleen. The hepatic echotexture is normal. The portal venous flow is
appropriate. The gallbladder wall is thickened to 5.6 mm, which could be
reactive to the ascites. The right kidney demonstrates a lower pole cyst
measuring 3.50 x 4.33 x 3.25 cm. The right kidney overall measures 10.31 x
5.33 x 4.24 cm. The gallbladder shows no evidence of stones. The common bile
duct diameter is 3.7 mm. Left kidney measures 10.44 x 4.15 x 4.39 cm. The
spleen is unremarkable. The inferior vena cava is patent. The aorta is
incompletely seen, but shows no focal abnormality. Neither kidney shows
obstruction or stones.
IMPRESSION: 1. Pleural effusion.
2. Ascites.
3. Thickened gallbladder wall which is nonspecific. There is a negative
sonographic Murphy's sign noted. The thickening in the gallbladder could be
reactive from the ascitic fluid. The gallbladder wall measures 5.6 mm.

[REDACTED]

## 2013-10-26 LAB — CBC AND DIFFERENTIAL
HCT: 40 % — AB (ref 41–53)
Hemoglobin: 13.7 g/dL (ref 13.5–17.5)
NEUTROS ABS: 4 /uL
Platelets: 219 10*3/uL (ref 150–399)
WBC: 6.1 10^3/mL

## 2013-10-26 LAB — BASIC METABOLIC PANEL
BUN: 29 mg/dL — AB (ref 4–21)
Creatinine: 1.3 mg/dL (ref 0.6–1.3)
GLUCOSE: 166 mg/dL
POTASSIUM: 4.2 mmol/L (ref 3.4–5.3)
SODIUM: 138 mmol/L (ref 137–147)

## 2013-10-26 LAB — PSA: PSA: 0.8

## 2013-10-26 LAB — LIPID PANEL
CHOLESTEROL: 213 mg/dL — AB (ref 0–200)
HDL: 64 mg/dL (ref 35–70)
LDL Cholesterol: 117 mg/dL
LDl/HDL Ratio: 1.8
Triglycerides: 162 mg/dL — AB (ref 40–160)

## 2013-10-26 LAB — HEPATIC FUNCTION PANEL
ALK PHOS: 48 U/L (ref 25–125)
ALT: 18 U/L (ref 10–40)
AST: 22 U/L (ref 14–40)
Bilirubin, Total: 0.6 mg/dL

## 2013-10-26 LAB — TSH: TSH: 7.3 u[IU]/mL — AB (ref 0.41–5.90)

## 2014-07-31 NOTE — Discharge Summary (Signed)
PATIENT NAME:  Bill Coleman, Bill Coleman MR#:  [id number] DATE OF BIRTH:  07-03-1945  DATE OF ADMISSION:  08/19/2011 DATE OF DISCHARGE:  08/23/2011  PRIMARY CARE PHYSICIAN: Richard L. Rosanna Randy, MD   CHIEF COMPLAINT:  I had a stroke.  HISTORY OF PRESENT ILLNESS: Please see admission History and Physical.   PROBLEM LIST:  1. Aortic valve thrombosis.  2. Central retinal artery occlusion.  3. Hypertension.  4. Hyperlipidemia.  5. Hypothyroidism.  6. History of Hodgkin's lymphoma.  7. History of malignant melanoma.   HOSPITAL COURSE: The patient is a 68 year old gentleman who was admitted for anticoagulation for aortic valve thrombosis. The patient recently experienced a central retinal artery occlusion which was felt to be an embolic event. Echocardiogram was performed which revealed normal left ventricular function with evidence of an aortic valve thrombus. The patient was admitted for anticoagulation, first treated with heparin bolus and drip, followed by warfarin 8 mg daily. The patient had an uncomplicated hospital course. He does have a history of hypertension. Blood pressures were elevated, and losartan was increased from 50 to 100 mg daily. On the morning of 08/23/2011, PT and INR was 25.8 and 2.3, respectively. He will be discharged.  DISCHARGE MEDICATIONS:  1. Aspirin 81 mg daily.  2. Warfarin 4 mg daily.  3. Losartan 100 mg daily.     FOLLOWUP: He has a PT/INR scheduled for 08/26/2011. He is scheduled to see me in follow-up in one week.   ____________________________ Isaias Cowman, MD ap:cbb D: 08/23/2011 08:06:30 ET T: 08/23/2011 10:47:59 ET JOB#: [id number]  cc: Isaias Cowman, MD, <Dictator> Richard L. Rosanna Randy, MD Isaias Cowman MD ELECTRONICALLY SIGNED 09/19/2011 17:39

## 2014-07-31 NOTE — Consult Note (Signed)
Ct scan results noted, no evidence of underlying malignancy.  Hypercoagulable work-up negative so far, several results still pending.  Patient will require a minimum of 6 months of anticoagulation with Coumadin with goal INR 2.0-3.0. Patient can followup in the [place] in 6-8 weeks for further evaluation and discussion of his results. consult.  Call with questions.  Electronic Signatures: Delight Hoh (MD)  (Signed on 15-May-13 12:35)  Authored  Last Updated: 15-May-13 12:35 by Delight Hoh (MD)

## 2014-07-31 NOTE — Consult Note (Signed)
History of Present Illness:   Reason for Consult Aortic valve thrombus.    HPI   Patient is a 69 year old male with a distant history of Hodgkin's lymphoma as well as melanoma.  He presented last week with acute onset of blindness.  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  He is admitted for anticoagulation and further workup of hypercoagulable disorder.  Currently, patient feels well and is asymptomatic.  He has no other neurologic complaints.  He is no recent fevers or illnesses.  His good appetite and denies weight loss.  He denies night sweats.  He has no chest pain or shortness of breath.  He denies any nausea, vomiting, constipation, or diarrhea.  He has no urinary complaints.  Patient feels at his baseline and offers no further specific complaints.  PFSH:   Additional Past Medical and Surgical History Past medical history: Hodgkin disease, melanoma, hypertension.  Past surgical history: Negative.  Family history: Negative and noncontributory.  Social history: Patient denies tobacco or alcohol.   Review of Systems:   Performance Status (ECOG) 0    Review of Systems   As per HPI. Otherwise, 10 point system review was negative.   NURSING NOTES: **Vital Signs.:   14-May-13 08:34    Vital Signs Type: Routine    Temperature Temperature (F): 97.9    Celsius: 36.6    Temperature Source: oral    Pulse Pulse: 68    Pulse source: per Dinamap    Respirations Respirations: 16    Systolic BP Systolic BP: 213    Diastolic BP (mmHg) Diastolic BP (mmHg): 086    Mean BP: 128    BP Source: Dinamap    Pulse Ox % Pulse Ox %: 97    Pulse Ox Activity Level: At rest    Oxygen Delivery: Room Air/ 21 %    11:17    Vital Signs Type: Routine    Pulse Pulse: 70    Systolic BP Systolic BP: 578    Diastolic BP (mmHg) Diastolic BP (mmHg): 93    Mean BP: 119    BP Source: Dinamap    Pulse Ox % Pulse Ox %: 94    Pulse Ox Activity Level: At rest     Oxygen Delivery: Room Air/ 21 %   Physical Exam:   Physical Exam General: Well-developed, well-nourished, no acute distress. Eyes: Pink conjunctiva, anicteric sclera. HEENT: Normocephalic, moist mucous membranes, clear oropharnyx. Lungs: Clear to auscultation bilaterally. Heart: Regular rate and rhythm. No rubs, murmurs, or gallops. Abdomen: Soft, nontender, nondistended. No organomegaly noted, normoactive bowel sounds. Musculoskeletal: No edema, cyanosis, or clubbing. Neuro: Alert, answering all questions appropriately. Cranial nerves grossly intact. Skin: No rashes or petechiae noted. Psych: Normal affect. Lymphatics: No cervical, calvicular, axillary or inguinal LAD.    No Known Allergies:     aspirin 325 mg oral tablet: 1 tab(s) orally once a day, Active, 0, None   losartan 50 mg oral tablet: 1 tab(s) orally once a day, Active, 0, None  Routine Hem:  13-May-13 13:20    WBC (CBC) 7.6   RBC (CBC) 4.51   Hemoglobin (CBC) 14.8   Hematocrit (CBC) 42.4   Platelet Count (CBC) 223   MCV 94   MCH 32.7   MCHC 34.8   RDW 12.4   Neutrophil % 67.2   Lymphocyte % 20.4   Monocyte % 9.7   Eosinophil % 1.9   Basophil % 0.8   Neutrophil # 5.1   Lymphocyte # 1.5  Monocyte # 0.7   Eosinophil # 0.1   Basophil # 0.1  Routine Coag:  13-May-13 13:20    Prothrombin 13.3   INR 1.0   Activated PTT (APTT) 35.4  Routine Chem:  13-May-13 13:20    Glucose, Serum 105   BUN 13   Creatinine (comp) 0.70   Sodium, Serum 139   Potassium, Serum 4.0   Chloride, Serum 104   CO2, Serum 30   Calcium (Total), Serum 8.8   Anion Gap 5   Osmolality (calc) 278   eGFR (African American) >60   eGFR (Non-African American) >60   Assessment and Plan:  Impression:   Aortic valve thrombus.  Plan:   1.  Thrombus: Unclear etiology.  A full hypercoagulable workup has been ordered and is currently pending.  In addition to the labs are ordered, will have factor V Leiden as well as prothrombin gene  mutation.  Given the unusual nature of the thrombus, will order a CT scan of the chest, abdomen, pelvis to assess for underlying recurrent malignancy.  Patient will require a minimum of 6 months of anticoagulation, possibly more if his hypercoagulable workup is positive.  Patient can followup in the [place] in 6-8 weeks for further evaluation and discussion of his results.  He expressed understanding and was in agreement with this plan.  Electronic Signatures: Delight Hoh (MD)  (Signed 14-May-13 14:03)  Authored: HISTORY OF PRESENT ILLNESS, PFSH, ROS, NURSING NOTES, PE, ALLERGIES, HOME MEDICATIONS, LABS, ASSESSMENT AND PLAN   Last Updated: 14-May-13 14:03 by Delight Hoh (MD)

## 2015-07-18 ENCOUNTER — Encounter
Admission: RE | Disposition: A | Discharge status: Home / Self Care | Payer: Self-pay | Source: Ambulatory Visit | Attending: Cardiology

## 2015-07-18 ENCOUNTER — Ambulatory Visit
Admission: RE | Admit: 2015-07-18 | Discharge: 2015-07-18 | Disposition: A | Discharge status: Home / Self Care | Payer: Medicare Other | Source: Ambulatory Visit | Attending: Cardiology | Admitting: Cardiology

## 2015-07-18 ENCOUNTER — Encounter: Payer: Self-pay | Admitting: *Deleted

## 2015-07-18 DIAGNOSIS — I252 Old myocardial infarction: Secondary | ICD-10-CM | POA: Insufficient documentation

## 2015-07-18 DIAGNOSIS — E119 Type 2 diabetes mellitus without complications: Secondary | ICD-10-CM | POA: Diagnosis not present

## 2015-07-18 DIAGNOSIS — K219 Gastro-esophageal reflux disease without esophagitis: Secondary | ICD-10-CM | POA: Insufficient documentation

## 2015-07-18 DIAGNOSIS — I420 Dilated cardiomyopathy: Secondary | ICD-10-CM | POA: Diagnosis present

## 2015-07-18 DIAGNOSIS — Z79899 Other long term (current) drug therapy: Secondary | ICD-10-CM | POA: Insufficient documentation

## 2015-07-18 DIAGNOSIS — E039 Hypothyroidism, unspecified: Secondary | ICD-10-CM | POA: Insufficient documentation

## 2015-07-18 DIAGNOSIS — I110 Hypertensive heart disease with heart failure: Secondary | ICD-10-CM | POA: Diagnosis not present

## 2015-07-18 DIAGNOSIS — Z801 Family history of malignant neoplasm of trachea, bronchus and lung: Secondary | ICD-10-CM | POA: Diagnosis not present

## 2015-07-18 DIAGNOSIS — E785 Hyperlipidemia, unspecified: Secondary | ICD-10-CM | POA: Diagnosis not present

## 2015-07-18 DIAGNOSIS — I351 Nonrheumatic aortic (valve) insufficiency: Secondary | ICD-10-CM | POA: Diagnosis not present

## 2015-07-18 DIAGNOSIS — Z8249 Family history of ischemic heart disease and other diseases of the circulatory system: Secondary | ICD-10-CM | POA: Insufficient documentation

## 2015-07-18 DIAGNOSIS — I5022 Chronic systolic (congestive) heart failure: Secondary | ICD-10-CM | POA: Diagnosis not present

## 2015-07-18 DIAGNOSIS — Z85820 Personal history of malignant melanoma of skin: Secondary | ICD-10-CM | POA: Diagnosis not present

## 2015-07-18 DIAGNOSIS — Z7901 Long term (current) use of anticoagulants: Secondary | ICD-10-CM | POA: Diagnosis not present

## 2015-07-18 DIAGNOSIS — Z8572 Personal history of non-Hodgkin lymphomas: Secondary | ICD-10-CM | POA: Insufficient documentation

## 2015-07-18 DIAGNOSIS — Z85828 Personal history of other malignant neoplasm of skin: Secondary | ICD-10-CM | POA: Diagnosis not present

## 2015-07-18 DIAGNOSIS — Z7982 Long term (current) use of aspirin: Secondary | ICD-10-CM | POA: Diagnosis not present

## 2015-07-18 DIAGNOSIS — Z87891 Personal history of nicotine dependence: Secondary | ICD-10-CM | POA: Insufficient documentation

## 2015-07-18 HISTORY — PX: CARDIAC CATHETERIZATION: SHX172

## 2015-07-18 LAB — PROTIME-INR
INR: 1.11
PROTHROMBIN TIME: 14.5 s (ref 11.4–15.0)

## 2015-07-18 SURGERY — LEFT HEART CATH AND CORONARY ANGIOGRAPHY
Anesthesia: Moderate Sedation

## 2015-07-18 MED ORDER — SODIUM CHLORIDE 0.9% FLUSH
3.0000 mL | Freq: Two times a day (BID) | INTRAVENOUS | Status: DC
Start: 2015-07-18 — End: 2015-07-18

## 2015-07-18 MED ORDER — SODIUM CHLORIDE 0.9 % IV SOLN: INTRAVENOUS | Status: DC

## 2015-07-18 MED ORDER — SODIUM CHLORIDE 0.9% FLUSH: 3.0000 mL | INTRAVENOUS | Status: DC | PRN

## 2015-07-18 MED ORDER — SODIUM CHLORIDE 0.9 % IV SOLN: 250.0000 mL | INTRAVENOUS | Status: DC | PRN

## 2015-07-18 MED ORDER — HEPARIN (PORCINE) IN NACL 2-0.9 UNIT/ML-% IJ SOLN
INTRAMUSCULAR | Status: AC
  Filled 2015-07-18: qty 500

## 2015-07-18 MED ORDER — SODIUM CHLORIDE 0.9% FLUSH: 3.0000 mL | Freq: Two times a day (BID) | INTRAVENOUS | Status: DC

## 2015-07-18 MED ORDER — MIDAZOLAM HCL 2 MG/2ML IJ SOLN
INTRAMUSCULAR | Status: AC
  Filled 2015-07-18: qty 2

## 2015-07-18 MED ORDER — SODIUM CHLORIDE 0.9 % WEIGHT BASED INFUSION: 1.0000 mL/kg/h | INTRAVENOUS | Status: DC

## 2015-07-18 MED ORDER — ASPIRIN 81 MG PO CHEW: 81.0000 mg | CHEWABLE_TABLET | ORAL | Status: DC

## 2015-07-18 MED ORDER — FENTANYL CITRATE (PF) 100 MCG/2ML IJ SOLN
INTRAMUSCULAR | Status: DC | PRN
  Administered 2015-07-18: 25 ug via INTRAVENOUS

## 2015-07-18 MED ORDER — MIDAZOLAM HCL 2 MG/2ML IJ SOLN
INTRAMUSCULAR | Status: DC | PRN
  Administered 2015-07-18: 1 mg via INTRAVENOUS

## 2015-07-18 MED ORDER — FENTANYL CITRATE (PF) 100 MCG/2ML IJ SOLN
INTRAMUSCULAR | Status: AC
  Filled 2015-07-18: qty 2

## 2015-07-18 MED ORDER — SODIUM CHLORIDE 0.9 % WEIGHT BASED INFUSION: 3.0000 mL/kg/h | INTRAVENOUS | Status: DC

## 2015-07-18 SURGICAL SUPPLY — 9 items
CATH INFINITI 5FR ANG PIGTAIL (CATHETERS) ×2 IMPLANT
CATH INFINITI 5FR JL4 (CATHETERS) ×2 IMPLANT
CATH INFINITI JR4 5F (CATHETERS) ×2 IMPLANT
DEVICE CLOSURE MYNXGRIP 5F (Vascular Products) ×2 IMPLANT
KIT MANI 3VAL PERCEP (MISCELLANEOUS) ×2 IMPLANT
NEEDLE PERC 18GX7CM (NEEDLE) ×2 IMPLANT
PACK CARDIAC CATH (CUSTOM PROCEDURE TRAY) ×2 IMPLANT
SHEATH AVANTI 5FR X 11CM (SHEATH) ×2 IMPLANT
WIRE EMERALD 3MM-J .035X150CM (WIRE) ×2 IMPLANT

## 2015-07-18 NOTE — Discharge Instructions (Signed)
Angiogram, Care After °Refer to this sheet in the next few weeks. These instructions provide you with information about caring for yourself after your procedure. Your health care provider may also give you more specific instructions. Your treatment has been planned according to current medical practices, but problems sometimes occur. Call your health care provider if you have any problems or questions after your procedure. °WHAT TO EXPECT AFTER THE PROCEDURE °After your procedure, it is typical to have the following: °· Bruising at the catheter insertion site that usually fades within 1-2 weeks. °· Blood collecting in the tissue (hematoma) that may be painful to the touch. It should usually decrease in size and tenderness within 1-2 weeks. °HOME CARE INSTRUCTIONS °· Take medicines only as directed by your health care provider. °· You may shower 24-48 hours after the procedure or as directed by your health care provider. Remove the bandage (dressing) and gently wash the site with plain soap and water. Pat the area dry with a clean towel. Do not rub the site, because this may cause bleeding. °· Do not take baths, swim, or use a hot tub until your health care provider approves. °· Check your insertion site every day for redness, swelling, or drainage. °· Do not apply powder or lotion to the site. °· Do not lift over 10 lb (4.5 kg) for 5 days after your procedure or as directed by your health care provider. °· Ask your health care provider when it is okay to: °¨ Return to work or school. °¨ Resume usual physical activities or sports. °¨ Resume sexual activity. °· Do not drive home if you are discharged the same day as the procedure. Have someone else drive you. °· You may drive 24 hours after the procedure unless otherwise instructed by your health care provider. °· Do not operate machinery or power tools for 24 hours after the procedure or as directed by your health care provider. °· If your procedure was done as an  outpatient procedure, which means that you went home the same day as your procedure, a responsible adult should be with you for the first 24 hours after you arrive home. °· Keep all follow-up visits as directed by your health care provider. This is important. °SEEK MEDICAL CARE IF: °· You have a fever. °· You have chills. °· You have increased bleeding from the catheter insertion site. Hold pressure on the site. °SEEK IMMEDIATE MEDICAL CARE IF: °· You have unusual pain at the catheter insertion site. °· You have redness, warmth, or swelling at the catheter insertion site. °· You have drainage (other than a small amount of blood on the dressing) from the catheter insertion site. °· The catheter insertion site is bleeding, and the bleeding does not stop after 30 minutes of holding steady pressure on the site. °· The area near or just beyond the catheter insertion site becomes pale, cool, tingly, or numb. °  °This information is not intended to replace advice given to you by your health care provider. Make sure you discuss any questions you have with your health care provider. °  °Document Released: 10/11/2004 Document Revised: 04/15/2014 Document Reviewed: 08/26/2012 °Elsevier Interactive Patient Education ©2016 [employer] ° °

## 2015-08-30 DIAGNOSIS — F419 Anxiety disorder, unspecified: Secondary | ICD-10-CM | POA: Insufficient documentation

## 2015-08-30 DIAGNOSIS — D649 Anemia, unspecified: Secondary | ICD-10-CM | POA: Insufficient documentation

## 2015-08-30 DIAGNOSIS — E785 Hyperlipidemia, unspecified: Secondary | ICD-10-CM | POA: Insufficient documentation

## 2015-08-30 DIAGNOSIS — C8190 Hodgkin lymphoma, unspecified, unspecified site: Secondary | ICD-10-CM | POA: Insufficient documentation

## 2015-08-30 DIAGNOSIS — E291 Testicular hypofunction: Secondary | ICD-10-CM | POA: Insufficient documentation

## 2015-08-30 DIAGNOSIS — I509 Heart failure, unspecified: Secondary | ICD-10-CM | POA: Insufficient documentation

## 2015-08-30 DIAGNOSIS — F32A Depression, unspecified: Secondary | ICD-10-CM | POA: Insufficient documentation

## 2015-08-30 DIAGNOSIS — R042 Hemoptysis: Secondary | ICD-10-CM | POA: Insufficient documentation

## 2015-08-30 DIAGNOSIS — R188 Other ascites: Secondary | ICD-10-CM | POA: Insufficient documentation

## 2015-08-30 DIAGNOSIS — N200 Calculus of kidney: Secondary | ICD-10-CM | POA: Insufficient documentation

## 2015-08-30 DIAGNOSIS — F17200 Nicotine dependence, unspecified, uncomplicated: Secondary | ICD-10-CM | POA: Insufficient documentation

## 2015-08-30 DIAGNOSIS — E890 Postprocedural hypothyroidism: Secondary | ICD-10-CM | POA: Insufficient documentation

## 2015-08-30 DIAGNOSIS — C439 Malignant melanoma of skin, unspecified: Secondary | ICD-10-CM | POA: Insufficient documentation

## 2015-08-30 DIAGNOSIS — I639 Cerebral infarction, unspecified: Secondary | ICD-10-CM | POA: Insufficient documentation

## 2015-08-30 DIAGNOSIS — R3911 Hesitancy of micturition: Secondary | ICD-10-CM | POA: Insufficient documentation

## 2015-08-30 DIAGNOSIS — H3410 Central retinal artery occlusion, unspecified eye: Secondary | ICD-10-CM | POA: Insufficient documentation

## 2015-08-30 DIAGNOSIS — F329 Major depressive disorder, single episode, unspecified: Secondary | ICD-10-CM | POA: Insufficient documentation

## 2015-08-30 DIAGNOSIS — N529 Male erectile dysfunction, unspecified: Secondary | ICD-10-CM | POA: Insufficient documentation

## 2015-08-30 DIAGNOSIS — K2100 Gastro-esophageal reflux disease with esophagitis, without bleeding: Secondary | ICD-10-CM | POA: Insufficient documentation

## 2015-08-30 DIAGNOSIS — E119 Type 2 diabetes mellitus without complications: Secondary | ICD-10-CM | POA: Insufficient documentation

## 2015-08-30 DIAGNOSIS — I219 Acute myocardial infarction, unspecified: Secondary | ICD-10-CM | POA: Insufficient documentation

## 2015-08-30 DIAGNOSIS — J90 Pleural effusion, not elsewhere classified: Secondary | ICD-10-CM | POA: Insufficient documentation

## 2015-08-30 DIAGNOSIS — I10 Essential (primary) hypertension: Secondary | ICD-10-CM | POA: Insufficient documentation

## 2015-08-30 DIAGNOSIS — I359 Nonrheumatic aortic valve disorder, unspecified: Secondary | ICD-10-CM | POA: Insufficient documentation

## 2015-09-11 ENCOUNTER — Inpatient Hospital Stay: Payer: Self-pay | Admitting: Family Medicine

## 2016-01-19 ENCOUNTER — Telehealth: Payer: Self-pay

## 2016-01-19 NOTE — Telephone Encounter (Signed)
Called Pt to schedule AWV with [facility] only for 10/19 -knb °

## 2016-03-25 ENCOUNTER — Encounter: Payer: Self-pay | Admitting: Family Medicine

## 2019-01-21 ENCOUNTER — Encounter (INDEPENDENT_AMBULATORY_CARE_PROVIDER_SITE_OTHER): Payer: Self-pay | Admitting: Ophthalmology

## 2019-01-21 ENCOUNTER — Other Ambulatory Visit: Payer: Self-pay

## 2019-01-21 ENCOUNTER — Ambulatory Visit (INDEPENDENT_AMBULATORY_CARE_PROVIDER_SITE_OTHER): Payer: Medicare Other | Admitting: Ophthalmology

## 2019-01-21 DIAGNOSIS — I10 Essential (primary) hypertension: Secondary | ICD-10-CM | POA: Diagnosis not present

## 2019-01-21 DIAGNOSIS — H35033 Hypertensive retinopathy, bilateral: Secondary | ICD-10-CM

## 2019-01-21 DIAGNOSIS — H25813 Combined forms of age-related cataract, bilateral: Secondary | ICD-10-CM

## 2019-01-21 DIAGNOSIS — H3581 Retinal edema: Secondary | ICD-10-CM | POA: Diagnosis not present

## 2019-01-21 DIAGNOSIS — H34232 Retinal artery branch occlusion, left eye: Secondary | ICD-10-CM

## 2019-01-21 DIAGNOSIS — H34231 Retinal artery branch occlusion, right eye: Secondary | ICD-10-CM

## 2019-01-21 DIAGNOSIS — H43811 Vitreous degeneration, right eye: Secondary | ICD-10-CM

## 2019-01-21 MED ORDER — BRIMONIDINE TARTRATE 0.2 % OP SOLN: 1.0000 [drp] | Freq: Two times a day (BID) | OPHTHALMIC | 1 refills | Status: AC

## 2019-01-21 NOTE — Progress Notes (Signed)
[facility] Note  01/21/2019     CHIEF COMPLAINT Patient presents for Retina Follow Up   HISTORY OF PRESENT ILLNESS: Bill Coleman is a 73 y.o. male who presents to the clinic today for:   HPI    Retina Follow Up    Patient presents with  Other (Vision loss).  In right eye.  This started 1 day ago.  Severity is moderate.  Duration of 1 day.  Since onset it is gradually worsening.  I, the attending physician,  performed the HPI with the patient and updated documentation appropriately.          Comments    Patient complains of sudden onset of purplish haze in superior vision OD starting yesterday. Vision has improved some today but still sees something purple floating in vision today. Did not have floaters or flashes prior to vision loss OD. Had something similar happen in OS in 2013 and saw Dr. Zigmund Coleman at that time. Took a long time for vision to return OS, but found clot in aortic valve at that time--had to start blood thinners. History of heart failure OS. Hx of aortic valve replacement. No problems since that procedure. Patient is borderline diabetic, last a1c 7.1 about 2 weeks ago. Hasn't checked BS recently. Does not take BP meds. Saw optometrist, Dr. Shearon Coleman last week and had no retinal issues at that time. Got new glasses RX from Dr. Shearon Coleman and can see better with new glasses.       Last edited by Bill Caffey, MD on 01/21/2019 11:32 AM. (History)    pt is a previous pt of Dr. Zigmund Coleman, last seen in 2013, pt has previous history of a blockage in his left eye, pt states when he noticed his left eye blockage, the entire inferior portion of his vision was obscured, he states he saw a cardiologist afterwards and had a blockage and ended up having his aortic valve replaced, pt states he has floaters in his right eye that looks like "zigzag" icicles, pt states he sees a "big purple iceberg" in his vision currently, he states it's in his superior vision, but  moves around, pt sees his heart dr every year, pt states the purple spot in his vision seems to have gotten better since the first day he saw it, he does not see it as often now  Referring physician: Isaias Cowman, MD [address] [facility] [place],  [place] [address]  HISTORICAL INFORMATION:   Selected notes from the MEDICAL RECORD NUMBER Previous pt of Dr. Zigmund Coleman, flashes / curtain over vision LEE:  Ocular Hx- PMH-   CURRENT MEDICATIONS: Current Outpatient Medications (Ophthalmic Drugs)  Medication Sig  . brimonidine (ALPHAGAN) 0.2 % ophthalmic solution Place 1 drop into the right eye 2 (two) times daily.   No current facility-administered medications for this visit.  (Ophthalmic Drugs)   Current Outpatient Medications (Other)  Medication Sig  . allopurinol (ZYLOPRIM) 100 MG tablet Take 100 mg by mouth daily.  . calcium-vitamin Coleman (OSCAL WITH Coleman) 500-200 MG-UNIT tablet Take 1 tablet by mouth.  . furosemide (LASIX) 40 MG tablet Take 40 mg by mouth 2 (two) times daily.  . magnesium 30 MG tablet Take 250 mg by mouth daily.  . metFORMIN (GLUCOPHAGE) 500 MG tablet Take 500 mg by mouth 2 (two) times daily with a meal.  . metolazone (ZAROXOLYN) 2.5 MG tablet Take 2.5 mg by mouth daily.  . potassium citrate (UROCIT-K) 10 MEQ (  1080 MG) SR tablet Take by mouth.  . sildenafil (VIAGRA) 100 MG tablet Take by mouth.  . warfarin (COUMADIN) 4 MG tablet Take 4 mg by mouth daily.  Marland Kitchen ALPRAZolam (XANAX) 0.5 MG tablet Take 1 mg by mouth at bedtime as needed. Reported on 07/18/2015  . Ascorbic Acid (VITAMIN C) 1000 MG tablet Take 1,000 mg by mouth daily.  Marland Kitchen aspirin 81 MG tablet Take 81 mg by mouth daily. Reported on 07/18/2015  . atorvastatin (LIPITOR) 20 MG tablet Take by mouth.  . carvedilol (COREG) 3.125 MG tablet Take 3.125 mg by mouth 2 (two) times daily with a meal.  . Cinnamon 500 MG TABS Take 500 mg by mouth.  . co-enzyme Q-10 30 MG capsule Take 30 mg by mouth 3  (three) times daily.  Marland Kitchen levothyroxine (SYNTHROID, LEVOTHROID) 25 MCG tablet Take 25 mcg by mouth daily. Reported on 07/18/2015  . Multiple Vitamin (MULTIVITAMIN) capsule Take 1 capsule by mouth daily.  . Omega-3 Fatty Acids (FISH OIL) 1000 MG CAPS Take 1 capsule by mouth daily.  . potassium chloride (MICRO-K) 10 MEQ CR capsule Take 40 mEq by mouth daily.  Marland Kitchen venlafaxine XR (EFFEXOR-XR) 75 MG 24 hr capsule Take by mouth.   No current facility-administered medications for this visit.  (Other)      REVIEW OF SYSTEMS: ROS    Positive for: Endocrine, Cardiovascular, Eyes   Negative for: Constitutional, Gastrointestinal, Neurological, Skin, Genitourinary, Musculoskeletal, HENT, Respiratory, Psychiatric, Allergic/Imm, Heme/Lymph   Last edited by Bill Coleman, COT on 01/21/2019  8:22 AM. (History)       ALLERGIES No Known Allergies  PAST MEDICAL HISTORY Past Medical History:  Diagnosis Date  . Bilateral pleural effusion   . Hodgkin's disease ([place])   . Left ventricular dysfunction   . Mini stroke ([place])   . Mitral valve disorder    Past Surgical History:  Procedure Laterality Date  . CARDIAC CATHETERIZATION N/A 07/18/2015   Procedure: Left Heart Cath and Coronary Angiography;  Surgeon: Bill Cowman, MD;  Location: [facility];  Service: Cardiovascular;  Laterality: N/A;  . FOOT SURGERY Left   . SPINE SURGERY     herinated disc    FAMILY HISTORY Family History  Problem Relation Age of Onset  . Lung cancer Father        was a smoker  . Emphysema Father        was a smoker  . Heart attack Brother   . Thyroid disease Sister   . Lung cancer Paternal Grandfather        worked in [employer]    SOCIAL HISTORY Social History   Tobacco Use  . Smoking status: Former Smoker    Packs/day: 1.00    Years: 20.00    Pack years: 20.00    Types: Cigarettes, Cigars    Quit date: 04/08/2001    Years since quitting: 17.8  . Smokeless tobacco: Former Systems developer    Types:  Chew  Substance Use Topics  . Alcohol use: No  . Drug use: No         OPHTHALMIC EXAM:  Base Eye Exam    Visual Acuity (Snellen - Linear)      Right Left   Dist cc 20/25 -2 20/40 +2   Dist ph cc 20/20 -2 20/25 -1   Correction: Glasses       Tonometry (Tonopen, 8:36 AM)      Right Left   Pressure 15 17  Pupils      Dark Light Shape React APD   Right 3 2 Round Brisk None   Left 3 2 Round Brisk None       Visual Fields (Counting fingers)      Left Right    Full Full       Extraocular Movement      Right Left    Full, Ortho Full, Ortho       Neuro/Psych    Oriented x3: Yes   Mood/Affect: Normal       Dilation    Both eyes: 1.0% Mydriacyl, 2.5% Phenylephrine @ 8:36 AM        Slit Lamp and Fundus Exam    Slit Lamp Exam      Right Left   Lids/Lashes Dermatochalasis - upper lid, mild Meibomian gland dysfunction Dermatochalasis - upper lid, mild Meibomian gland dysfunction   Conjunctiva/Sclera Temporal Pinguecula, trace Injection Temporal Pinguecula, trace Injection   Cornea 2+ inferior Punctate epithelial erosions 2+ inferior Punctate epithelial erosions   Anterior Chamber Deep and quiet Deep, narrow angles   Iris Round and dilated Round and dilated   Lens 2+ Nuclear sclerosis, 3+ Cortical cataract 2+ Nuclear sclerosis, 3+ Cortical cataract   Vitreous Vitreous syneresis, Posterior vitreous detachment Vitreous syneresis       Fundus Exam      Right Left   Disc Pink and Sharp Pink and Sharp   C/Coleman Ratio 0.3 0.3   Macula Flat, Good foveal reflex, inferior parafoveal patch of mild retinal whitening Flat, Good foveal reflex, trace Epiretinal membrane, mild inferior Atrophy   Vessels Vascular attenuation, Tortuous, obvious embolis Vascular attenuation greatest inferiorly, mild Tortuousity   Periphery Attached, no heme Attached, No heme         Refraction    Wearing Rx      Sphere Cylinder Axis Add   Right +0.50 +0.75 005 +2.50   Left +0.50 Sphere   +2.50       Manifest Refraction      Dist VA   Right 20/25   Left 20/25-2          IMAGING AND PROCEDURES  Imaging and Procedures for @TODAY @  OCT, Retina - OU - Both Eyes       Right Eye Quality was good. Central Foveal Thickness: 261. Progression has no prior data. Findings include normal foveal contour, intraretinal hyper-reflective material, no IRF, no SRF (Focal thickening and middle retinal hyper reflectivity (PAMM) inferior parafovea).   Left Eye Quality was good. Central Foveal Thickness: 263. Progression has no prior data. Findings include normal foveal contour, no IRF, no SRF, inner retinal atrophy.   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 CME - cystoid macular edema. PED - pigment epithelial detachment. IRF - intraretinal fluid. SRF - subretinal fluid. EZ - ellipsoid zone. ERM - epiretinal membrane. ORA - outer retinal atrophy. ORT - outer retinal tubulation. SRHM - subretinal hyper-reflective material        Fluorescein Angiography Optos (Transit OD)       Right Eye   Progression has no prior data. Early phase findings include delayed filling (?enlarged FAZ). Mid/Late phase findings include normal observations (Enlarged FAZ; no leakage or vascular perfusion defect).   Left Eye   Progression has no prior data. Early phase findings include normal  observations (?mild enlarged FAZ). Mid/Late phase findings include normal observations (?mild enlarged FAZ).   Notes **Images stored on drive**  Impression: OD: delayed filling; Enlarged FAZ; no leakage or vascular perfusion defect OS: normal study                  ASSESSMENT/PLAN:    ICD-10-CM   1. 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 Retinal edema  H35.81 OCT, Retina - OU - Both  Eyes  3. BRAO (branch retinal artery occlusion), left  H34.232   4. Essential hypertension  I10   5. Hypertensive retinopathy of both eyes  H35.033 Fluorescein Angiography Optos (Transit OD)  6. Posterior vitreous detachment of right eye  H43.811   7. Combined forms of age-related cataract of both eyes  H25.813     1,2. Acute paracentral acute middle maculopathy (PAMM) / BRAO OD  - pt with history of BRAO OS (2013) and significant cardiac history (aortic valve replacement, left ventricular dysfunction, on warfarin), presents urgently with a 1 day history of visual disturbance OD  - reports purple "iceberg" or "haze" in superior paracentral visual field  - denies headache, temporal pain, jaw claudication and all symptoms of giant cell arteritis  - BCVA remains 20/20  - exam shows inferior paracentral patch of mild retinal whitening and edema  - OCT shows focal hyperreflectivity of middle retinal layers consistent with PAMM  - FA shows mild delay in filling time  - OCT-A shows small vessel ischemia of middle retinal layers inferior to fovea corresponding to area of hyperreflectivity on OCT  - recommend starting brimonidine BID OD to lower IOP and for neuroprotective effect  - advised urgent cardiology evaluation and work up, spoke with PA at cardiology office, who advised sending pt to the ED for urgent evaluation  - recommend repeat carotid u/s, echocardiogram, CBC, CMP, ESR, CRP, possible MRI/MRA  - f/u 3-4 weeks  3. History of inferior BRAO OS -- 2013  - was seen by Dr. Zigmund Coleman at the time  - prompted full cardiovascular work up  - today, vision remains good OS (20/25) but OCT shows inner retinal atrophy inferiorly in area of previous BRAO  - monitor    4,5. Hypertensive retinopathy OU  - discussed importance of tight BP control  - monitor  6. PVD OD  7. Mixed form age related cataracts OU  - The symptoms of cataract, surgical options, and treatments and risks were discussed with  patient.  - discussed diagnosis and progression  - monitor   Ophthalmic Meds Ordered this visit:  Meds ordered this encounter  Medications  . brimonidine (ALPHAGAN) 0.2 % ophthalmic solution    Sig: Place 1 drop into the right eye 2 (two) times daily.    Dispense:  10 mL    Refill:  1       Return for f/u 3-4 weeks, BRAO OD, DFE, OCT.  There are no Patient Instructions on file for this visit.   Explained the diagnoses, plan, and follow up with the patient and they expressed understanding.  Patient expressed understanding of the importance of proper follow up care.   This document serves as a record of services personally performed by Gardiner Sleeper, MD, PhD. It was created on their behalf by Ernest Mallick, OA, an ophthalmic assistant. The creation of this record is the provider's dictation and/or activities during the visit.    Electronically signed by: Ernest Mallick, OA 10.15.2020 10:31 PM   Sharyon Cable.  Coralyn Pear, M.Coleman., Ph.Coleman. Diseases & Surgery of the Retina and [place]  I have reviewed the above documentation for accuracy and completeness, and I agree with the above. Gardiner Sleeper, M.Coleman., Ph.Coleman. 01/21/19 10:31 PM    Abbreviations: M myopia (nearsighted); A astigmatism; H hyperopia (farsighted); P presbyopia; Mrx spectacle prescription;  CTL contact lenses; OD right eye; OS left eye; OU both eyes  XT exotropia; ET esotropia; PEK punctate epithelial keratitis; PEE punctate epithelial erosions; DES dry eye syndrome; MGD meibomian gland dysfunction; ATs artificial tears; PFAT's preservative free artificial tears; [place] nuclear sclerotic cataract; PSC posterior subcapsular cataract; ERM epi-retinal membrane; PVD posterior vitreous detachment; RD retinal detachment; DM diabetes mellitus; DR diabetic retinopathy; NPDR non-proliferative diabetic retinopathy; PDR proliferative diabetic retinopathy; CSME clinically significant macular edema; DME diabetic  macular edema; dbh dot blot hemorrhages; CWS cotton wool spot; POAG primary open angle glaucoma; C/Coleman cup-to-disc ratio; HVF humphrey visual field; GVF goldmann visual field; OCT optical coherence tomography; IOP intraocular pressure; BRVO Branch retinal vein occlusion; CRVO central retinal vein occlusion; CRAO central retinal artery occlusion; BRAO branch retinal artery occlusion; RT retinal tear; SB scleral buckle; PPV pars plana vitrectomy; VH Vitreous hemorrhage; PRP panretinal laser photocoagulation; IVK intravitreal kenalog; VMT vitreomacular traction; MH Macular hole;  NVD neovascularization of the disc; NVE neovascularization elsewhere; AREDS age related eye disease study; ARMD age related macular degeneration; POAG primary open angle glaucoma; EBMD epithelial/anterior basement membrane dystrophy; ACIOL anterior chamber intraocular lens; IOL intraocular lens; PCIOL posterior chamber intraocular lens; Phaco/IOL phacoemulsification with intraocular lens placement; [place] photorefractive keratectomy; LASIK laser assisted in situ keratomileusis; HTN hypertension; DM diabetes mellitus; COPD chronic obstructive pulmonary disease

## 2019-02-17 ENCOUNTER — Encounter (INDEPENDENT_AMBULATORY_CARE_PROVIDER_SITE_OTHER): Payer: Medicare Other | Admitting: Ophthalmology

## 2019-11-24 ENCOUNTER — Ambulatory Visit: Payer: Medicare Other | Admitting: Dermatology

## 2019-12-30 ENCOUNTER — Other Ambulatory Visit: Payer: Self-pay

## 2019-12-30 ENCOUNTER — Ambulatory Visit (INDEPENDENT_AMBULATORY_CARE_PROVIDER_SITE_OTHER): Payer: Medicare Other | Admitting: Dermatology

## 2019-12-30 ENCOUNTER — Encounter: Payer: Self-pay | Admitting: Dermatology

## 2019-12-30 DIAGNOSIS — Z86018 Personal history of other benign neoplasm: Secondary | ICD-10-CM

## 2019-12-30 DIAGNOSIS — D239 Other benign neoplasm of skin, unspecified: Secondary | ICD-10-CM

## 2019-12-30 DIAGNOSIS — D485 Neoplasm of uncertain behavior of skin: Secondary | ICD-10-CM | POA: Diagnosis not present

## 2019-12-30 DIAGNOSIS — Z85820 Personal history of malignant melanoma of skin: Secondary | ICD-10-CM | POA: Diagnosis not present

## 2019-12-30 DIAGNOSIS — Z1283 Encounter for screening for malignant neoplasm of skin: Secondary | ICD-10-CM

## 2019-12-30 DIAGNOSIS — D229 Melanocytic nevi, unspecified: Secondary | ICD-10-CM

## 2019-12-30 DIAGNOSIS — L820 Inflamed seborrheic keratosis: Secondary | ICD-10-CM | POA: Diagnosis not present

## 2019-12-30 DIAGNOSIS — L578 Other skin changes due to chronic exposure to nonionizing radiation: Secondary | ICD-10-CM

## 2019-12-30 DIAGNOSIS — D492 Neoplasm of unspecified behavior of bone, soft tissue, and skin: Secondary | ICD-10-CM

## 2019-12-30 DIAGNOSIS — L814 Other melanin hyperpigmentation: Secondary | ICD-10-CM

## 2019-12-30 DIAGNOSIS — L821 Other seborrheic keratosis: Secondary | ICD-10-CM

## 2019-12-30 DIAGNOSIS — D1800 Hemangioma unspecified site: Secondary | ICD-10-CM

## 2019-12-30 HISTORY — DX: Other benign neoplasm of skin, unspecified: D23.9

## 2019-12-30 NOTE — Progress Notes (Signed)
Follow-Up Visit   Subjective  Bill Coleman is a 74 y.o. male who presents for the following: Annual Exam (Total body skin exam, hx of Melanoma, Dysplastic Nevi). The patient presents for Total-Body Skin Exam (TBSE) for skin cancer screening and mole check.  The following portions of the chart were reviewed this encounter and updated as appropriate:  Tobacco  Allergies  Meds  Problems  Med Hx  Surg Hx  Fam Hx     Review of Systems:  No other skin or systemic complaints except as noted in HPI or Assessment and Plan.  Objective  Well appearing patient in no apparent distress; mood and affect are within normal limits.  A full examination was performed including scalp, head, eyes, ears, nose, lips, neck, chest, axillae, abdomen, back, buttocks, bilateral upper extremities, bilateral lower extremities, hands, feet, fingers, toes, fingernails, and toenails. All findings within normal limits unless otherwise noted below.  Objective  Left nasal ali: 0.4cm flesh colored pap  Objective  Left epigastric: 0.6cm irregular brown macule  Objective  face/trunk x 23 (23): Erythematous keratotic or waxy stuck-on papule or plaque.   Objective  upper back: Well healed scar with no evidence of recurrence, no lymphadenopathy.    Assessment & Plan    Lentigines - Scattered tan macules - Discussed due to sun exposure - Benign, observe - Call for any changes  Seborrheic Keratoses - Stuck-on, waxy, tan-brown papules and plaques  - Discussed benign etiology and prognosis. - Observe - Call for any changes  Melanocytic Nevi - Tan-brown and/or pink-flesh-colored symmetric macules and papules - Benign appearing on exam today - Observation - Call clinic for new or changing moles - Recommend daily use of broad spectrum spf 30+ sunscreen to sun-exposed areas.   Hemangiomas - Red papules - Discussed benign nature - Observe - Call for any changes  Actinic Damage - diffuse scaly  erythematous macules with underlying dyspigmentation - Recommend daily broad spectrum sunscreen SPF 30+ to sun-exposed areas, reapply every 2 hours as needed.  - Call for new or changing lesions.  Skin cancer screening performed today.  History of Dysplastic Nevi - No evidence of recurrence today - Recommend regular full body skin exams - Recommend daily broad spectrum sunscreen SPF 30+ to sun-exposed areas, reapply every 2 hours as needed.  - Call if any new or changing lesions are noted between office visits - R medial infrapectoral, R post flank, R lat buttocks  Neoplasm of skin (2) Left nasal ala  Skin / nail biopsy Type of biopsy: tangential   Informed consent: discussed and consent obtained   Timeout: patient name, date of birth, surgical site, and procedure verified   Procedure prep:  Patient was prepped and draped in usual sterile fashion Prep type:  Isopropyl alcohol Anesthesia: the lesion was anesthetized in a standard fashion   Anesthetic:  1% lidocaine w/ epinephrine 1-100,000 buffered w/ 8.4% NaHCO3 Instrument used: flexible razor blade   Outcome: patient tolerated procedure well   Post-procedure details: sterile dressing applied and wound care instructions given   Dressing type: bandage and bacitracin    Specimen 1 - Surgical pathology Differential Diagnosis: D48.5 Fibrous pap r/o BCC Check Margins: yes 0.4cm flesh colored pap  Left epigastric  Epidermal / dermal shaving  Lesion diameter (cm):  0.6 Informed consent: discussed and consent obtained   Timeout: patient name, date of birth, surgical site, and procedure verified   Procedure prep:  Patient was prepped and draped in usual sterile fashion Prep  type:  Isopropyl alcohol Anesthesia: the lesion was anesthetized in a standard fashion   Anesthetic:  1% lidocaine w/ epinephrine 1-100,000 buffered w/ 8.4% NaHCO3 Instrument used: flexible razor blade   Hemostasis achieved with: pressure, aluminum chloride  and electrodesiccation   Outcome: patient tolerated procedure well   Post-procedure details: sterile dressing applied and wound care instructions given   Dressing type: bandage and petrolatum    Specimen 2 - Surgical pathology Differential Diagnosis: D48.5 Nevus vs Dysplastic Nevus Check Margins: yes 0.6cm irregular brown macule  L nasal ali Fibrous pap r/o BCC, bx today  L epigastric Nevus vs Dysplastic Nevus shave removal/bx today  Inflamed seborrheic keratosis (23) face/trunk x 23  Destruction of lesion - face/trunk x 23 Complexity: simple   Destruction method: cryotherapy   Informed consent: discussed and consent obtained   Timeout:  patient name, date of birth, surgical site, and procedure verified Lesion destroyed using liquid nitrogen: Yes   Region frozen until ice ball extended beyond lesion: Yes   Outcome: patient tolerated procedure well with no complications   Post-procedure details: wound care instructions given    History of melanoma upper back No lymphadenopathy Clear. Observe for recurrence. Call clinic for new or changing lesions.  Recommend regular skin exams, daily broad-spectrum spf 30+ sunscreen use, and photoprotection.     Return in about 1 year (around 12/29/2020) for TBSE, hx of Melanoma, dysplastic.  I, Othelia Pulling, RMA, am acting as scribe for Sarina Ser, MD .  Documentation: I have reviewed the above documentation for accuracy and completeness, and I agree with the above.  Sarina Ser, MD

## 2019-12-30 NOTE — Patient Instructions (Signed)

## 2020-01-06 ENCOUNTER — Telehealth: Payer: Self-pay

## 2020-01-06 NOTE — Telephone Encounter (Signed)
-----   Message from Ralene Bathe, MD sent at 01/04/2020  6:38 PM EDT ----- 1. Skin , left nasal ali SEBACEOUS GLAND HYPERPLASIA 2. 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

## 2020-01-06 NOTE — Telephone Encounter (Signed)
Patient informed of pathology results 

## 2020-03-20 ENCOUNTER — Other Ambulatory Visit
Admission: RE | Admit: 2020-03-20 | Discharge: 2020-03-20 | Disposition: A | Discharge status: Home / Self Care | Payer: Medicare Other | Source: Ambulatory Visit | Attending: Cardiology | Admitting: Cardiology

## 2020-03-20 DIAGNOSIS — I5022 Chronic systolic (congestive) heart failure: Secondary | ICD-10-CM | POA: Diagnosis present

## 2020-03-20 LAB — BRAIN NATRIURETIC PEPTIDE: B Natriuretic Peptide: 1899.7 pg/mL — ABNORMAL HIGH (ref 0.0–100.0)

## 2020-04-04 ENCOUNTER — Other Ambulatory Visit: Payer: Self-pay

## 2020-04-04 ENCOUNTER — Other Ambulatory Visit
Admission: RE | Admit: 2020-04-04 | Discharge: 2020-04-04 | Disposition: A | Discharge status: Home / Self Care | Payer: Medicare Other | Source: Ambulatory Visit | Attending: Cardiology | Admitting: Cardiology

## 2020-04-04 DIAGNOSIS — Z20822 Contact with and (suspected) exposure to covid-19: Secondary | ICD-10-CM | POA: Diagnosis not present

## 2020-04-04 DIAGNOSIS — Z01812 Encounter for preprocedural laboratory examination: Secondary | ICD-10-CM | POA: Insufficient documentation

## 2020-04-04 LAB — SARS CORONAVIRUS 2 (TAT 6-24 HRS): SARS Coronavirus 2: NEGATIVE

## 2020-04-06 ENCOUNTER — Encounter: Payer: Self-pay | Admitting: Cardiology

## 2020-04-06 ENCOUNTER — Ambulatory Visit
Admission: RE | Admit: 2020-04-06 | Discharge: 2020-04-06 | Disposition: A | Discharge status: Home / Self Care | Payer: Medicare Other | Attending: Cardiology | Admitting: Cardiology

## 2020-04-06 ENCOUNTER — Other Ambulatory Visit: Payer: Self-pay

## 2020-04-06 ENCOUNTER — Encounter
Admission: RE | Disposition: A | Discharge status: Home / Self Care | Payer: Self-pay | Source: Home / Self Care | Attending: Cardiology

## 2020-04-06 DIAGNOSIS — I420 Dilated cardiomyopathy: Secondary | ICD-10-CM | POA: Insufficient documentation

## 2020-04-06 DIAGNOSIS — E785 Hyperlipidemia, unspecified: Secondary | ICD-10-CM | POA: Diagnosis not present

## 2020-04-06 DIAGNOSIS — Z87891 Personal history of nicotine dependence: Secondary | ICD-10-CM | POA: Insufficient documentation

## 2020-04-06 DIAGNOSIS — I340 Nonrheumatic mitral (valve) insufficiency: Secondary | ICD-10-CM | POA: Diagnosis present

## 2020-04-06 DIAGNOSIS — I5022 Chronic systolic (congestive) heart failure: Secondary | ICD-10-CM | POA: Diagnosis not present

## 2020-04-06 DIAGNOSIS — Z7901 Long term (current) use of anticoagulants: Secondary | ICD-10-CM | POA: Insufficient documentation

## 2020-04-06 DIAGNOSIS — Z79899 Other long term (current) drug therapy: Secondary | ICD-10-CM | POA: Insufficient documentation

## 2020-04-06 DIAGNOSIS — Z7984 Long term (current) use of oral hypoglycemic drugs: Secondary | ICD-10-CM | POA: Diagnosis not present

## 2020-04-06 DIAGNOSIS — I110 Hypertensive heart disease with heart failure: Secondary | ICD-10-CM | POA: Diagnosis not present

## 2020-04-06 DIAGNOSIS — I272 Pulmonary hypertension, unspecified: Secondary | ICD-10-CM | POA: Diagnosis not present

## 2020-04-06 DIAGNOSIS — I2510 Atherosclerotic heart disease of native coronary artery without angina pectoris: Secondary | ICD-10-CM | POA: Insufficient documentation

## 2020-04-06 HISTORY — PX: RIGHT/LEFT HEART CATH AND CORONARY ANGIOGRAPHY: CATH118266

## 2020-04-06 SURGERY — RIGHT/LEFT HEART CATH AND CORONARY ANGIOGRAPHY
Anesthesia: Moderate Sedation

## 2020-04-06 MED ORDER — ONDANSETRON HCL 4 MG/2ML IJ SOLN: 4.0000 mg | Freq: Four times a day (QID) | INTRAMUSCULAR | Status: DC | PRN

## 2020-04-06 MED ORDER — SODIUM CHLORIDE 0.9% FLUSH: 3.0000 mL | Freq: Two times a day (BID) | INTRAVENOUS | Status: DC

## 2020-04-06 MED ORDER — FENTANYL CITRATE (PF) 100 MCG/2ML IJ SOLN
INTRAMUSCULAR | Status: AC
  Filled 2020-04-06: qty 2

## 2020-04-06 MED ORDER — LIDOCAINE HCL (PF) 1 % IJ SOLN
INTRAMUSCULAR | Status: DC | PRN
  Administered 2020-04-06: 2 mL

## 2020-04-06 MED ORDER — SODIUM CHLORIDE 0.9% FLUSH: 3.0000 mL | INTRAVENOUS | Status: DC | PRN

## 2020-04-06 MED ORDER — LABETALOL HCL 5 MG/ML IV SOLN: 10.0000 mg | INTRAVENOUS | Status: DC | PRN

## 2020-04-06 MED ORDER — SODIUM CHLORIDE 0.9 % WEIGHT BASED INFUSION: 1.0000 mL/kg/h | INTRAVENOUS | Status: DC

## 2020-04-06 MED ORDER — IOHEXOL 300 MG/ML  SOLN
INTRAMUSCULAR | Status: DC | PRN
  Administered 2020-04-06: 100 mL

## 2020-04-06 MED ORDER — ASPIRIN 81 MG PO CHEW: 81.0000 mg | CHEWABLE_TABLET | ORAL | Status: DC

## 2020-04-06 MED ORDER — MIDAZOLAM HCL 2 MG/2ML IJ SOLN
INTRAMUSCULAR | Status: DC | PRN
  Administered 2020-04-06: 1 mg via INTRAVENOUS

## 2020-04-06 MED ORDER — HEPARIN (PORCINE) IN NACL 1000-0.9 UT/500ML-% IV SOLN
INTRAVENOUS | Status: DC | PRN
  Administered 2020-04-06: 500 mL

## 2020-04-06 MED ORDER — SODIUM CHLORIDE 0.9 % IV SOLN: 250.0000 mL | INTRAVENOUS | Status: DC | PRN

## 2020-04-06 MED ORDER — VERAPAMIL HCL 2.5 MG/ML IV SOLN
INTRAVENOUS | Status: AC
  Filled 2020-04-06: qty 2

## 2020-04-06 MED ORDER — VERAPAMIL HCL 2.5 MG/ML IV SOLN
INTRAVENOUS | Status: DC | PRN
  Administered 2020-04-06: 2.5 mg via INTRA_ARTERIAL

## 2020-04-06 MED ORDER — HYDRALAZINE HCL 20 MG/ML IJ SOLN
10.0000 mg | INTRAMUSCULAR | Status: DC | PRN
Start: 2020-04-06 — End: 2020-04-06

## 2020-04-06 MED ORDER — MIDAZOLAM HCL 2 MG/2ML IJ SOLN
INTRAMUSCULAR | Status: AC
  Filled 2020-04-06: qty 2

## 2020-04-06 MED ORDER — SODIUM CHLORIDE 0.9 % IV SOLN: INTRAVENOUS | Status: DC

## 2020-04-06 MED ORDER — ACETAMINOPHEN 325 MG PO TABS: 650.0000 mg | ORAL_TABLET | ORAL | Status: DC | PRN

## 2020-04-06 MED ORDER — HEPARIN SODIUM (PORCINE) 1000 UNIT/ML IJ SOLN
INTRAMUSCULAR | Status: AC
  Filled 2020-04-06: qty 1

## 2020-04-06 MED ORDER — HEPARIN SODIUM (PORCINE) 1000 UNIT/ML IJ SOLN
INTRAMUSCULAR | Status: DC | PRN
  Administered 2020-04-06: 3500 [IU] via INTRAVENOUS

## 2020-04-06 MED ORDER — LIDOCAINE HCL (PF) 1 % IJ SOLN
INTRAMUSCULAR | Status: AC
  Filled 2020-04-06: qty 30

## 2020-04-06 MED ORDER — FENTANYL CITRATE (PF) 100 MCG/2ML IJ SOLN
INTRAMUSCULAR | Status: DC | PRN
  Administered 2020-04-06: 25 ug via INTRAVENOUS

## 2020-04-06 MED ORDER — HEPARIN (PORCINE) IN NACL 1000-0.9 UT/500ML-% IV SOLN
INTRAVENOUS | Status: AC
  Filled 2020-04-06: qty 1000

## 2020-04-06 SURGICAL SUPPLY — 13 items
CATH 5F 110X4 TIG (CATHETERS) ×2 IMPLANT
CATH BALLN WEDGE 5F 110CM (CATHETERS) ×2 IMPLANT
CATH INFINITI 5FR ANG PIGTAIL (CATHETERS) ×2 IMPLANT
CATH INFINITI JR4 5F (CATHETERS) ×2 IMPLANT
DEVICE RAD TR BAND REGULAR (VASCULAR PRODUCTS) ×2 IMPLANT
GLIDESHEATH SLEND SS 6F .021 (SHEATH) ×2 IMPLANT
GUIDEWIRE EMER 3M J .025X150CM (WIRE) ×2 IMPLANT
GUIDEWIRE INQWIRE 1.5J.035X260 (WIRE) ×1 IMPLANT
INQWIRE 1.5J .035X260CM (WIRE) ×2
KIT MANI 3VAL PERCEP (MISCELLANEOUS) ×2 IMPLANT
PACK CARDIAC CATH (CUSTOM PROCEDURE TRAY) ×2 IMPLANT
SHEATH GLIDE SLENDER 4/5FR (SHEATH) ×2 IMPLANT
WIRE EMERALD ST .035X150CM (WIRE) ×2 IMPLANT

## 2020-04-06 NOTE — Discharge Instructions (Signed)
 Radial Site Care  This sheet gives you information about how to care for yourself after your procedure. Your health care provider may also give you more specific instructions. If you have problems or questions, contact your health care provider. What can I expect after the procedure? After the procedure, it is common to have:  Bruising and tenderness at the catheter insertion area. Follow these instructions at home: Medicines  Take over-the-counter and prescription medicines only as told by your health care provider. Insertion site care  Follow instructions from your health care provider about how to take care of your insertion site. Make sure you: ? Wash your hands with soap and water before you change your bandage (dressing). If soap and water are not available, use hand sanitizer. ? Change your dressing as told by your health care provider. ? Leave stitches (sutures), skin glue, or adhesive strips in place. These skin closures may need to stay in place for 2 weeks or longer. If adhesive strip edges start to loosen and curl up, you may trim the loose edges. Do not remove adhesive strips completely unless your health care provider tells you to do that.  Check your insertion site every day for signs of infection. Check for: ? Redness, swelling, or pain. ? Fluid or blood. ? Pus or a bad smell. ? Warmth.  Do not take baths, swim, or use a hot tub until your health care provider approves.  You may shower 24-48 hours after the procedure, or as directed by your health care provider. ? Remove the dressing and gently wash the site with plain soap and water. ? Pat the area dry with a clean towel. ? Do not rub the site. That could cause bleeding.  Do not apply powder or lotion to the site. Activity   For 24 hours after the procedure, or as directed by your health care provider: ? Do not flex or bend the affected arm. ? Do not push or pull heavy objects with the affected arm. ? Do not  drive yourself home from the hospital or clinic. You may drive 24 hours after the procedure unless your health care provider tells you not to. ? Do not operate machinery or power tools.  Do not lift anything that is heavier than 10 lb (4.5 kg), or the limit that you are told, until your health care provider says that it is safe.  Ask your health care provider when it is okay to: ? Return to work or school. ? Resume usual physical activities or sports. ? Resume sexual activity. General instructions  If the catheter site starts to bleed, raise your arm and put firm pressure on the site. If the bleeding does not stop, get help right away. This is a medical emergency.  If you went home on the same day as your procedure, a responsible adult should be with you for the first 24 hours after you arrive home.  Keep all follow-up visits as told by your health care provider. This is important. Contact a health care provider if:  You have a fever.  You have redness, swelling, or yellow drainage around your insertion site. Get help right away if:  You have unusual pain at the radial site.  The catheter insertion area swells very fast.  The insertion area is bleeding, and the bleeding does not stop when you hold steady pressure on the area.  Your arm or hand becomes pale, cool, tingly, or numb. These symptoms may represent a serious   problem that is an emergency. Do not wait to see if the symptoms will go away. Get medical help right away. Call your local emergency services (911 in the [place] Do not drive yourself to the hospital. Summary  After the procedure, it is common to have bruising and tenderness at the site.  Follow instructions from your health care provider about how to take care of your radial site wound. Check the wound every day for signs of infection.  Do not lift anything that is heavier than 10 lb (4.5 kg), or the limit that you are told, until your health care provider says  that it is safe. This information is not intended to replace advice given to you by your health care provider. Make sure you discuss any questions you have with your health care provider. Document Revised: 04/30/2017 Document Reviewed: 04/30/2017 Elsevier Patient Education  2020 [employer]        Moderate Conscious Sedation, Adult, Care After These instructions provide you with information about caring for yourself after your procedure. Your health care provider may also give you more specific instructions. Your treatment has been planned according to current medical practices, but problems sometimes occur. Call your health care provider if you have any problems or questions after your procedure. What can I expect after the procedure? After your procedure, it is common:  To feel sleepy for several hours.  To feel clumsy and have poor balance for several hours.  To have poor judgment for several hours.  To vomit if you eat too soon. Follow these instructions at home: For at least 24 hours after the procedure:   Do not: ? Participate in activities where you could fall or become injured. ? Drive. ? Use heavy machinery. ? Drink alcohol. ? Take sleeping pills or medicines that cause drowsiness. ? Make important decisions or sign legal documents. ? Take care of children on your own.  Rest. Eating and drinking  Follow the diet recommended by your health care provider.  If you vomit: ? Drink water, juice, or soup when you can drink without vomiting. ? Make sure you have little or no nausea before eating solid foods. General instructions  Have a responsible adult stay with you until you are awake and alert.  Take over-the-counter and prescription medicines only as told by your health care provider.  If you smoke, do not smoke without supervision.  Keep all follow-up visits as told by your health care provider. This is important. Contact a health care provider  if:  You keep feeling nauseous or you keep vomiting.  You feel light-headed.  You develop a rash.  You have a fever. Get help right away if:  You have trouble breathing. This information is not intended to replace advice given to you by your health care provider. Make sure you discuss any questions you have with your health care provider. Document Revised: 03/07/2017 Document Reviewed: 07/15/2015 Elsevier Patient Education  2020 [employer]  

## 2021-01-03 ENCOUNTER — Encounter: Payer: Medicare Other | Admitting: Dermatology

## 2021-01-16 ENCOUNTER — Ambulatory Visit: Payer: Medicare Other | Admitting: Dermatology

## 2021-01-17 ENCOUNTER — Encounter: Payer: Self-pay | Admitting: Dermatology

## 2021-01-17 ENCOUNTER — Other Ambulatory Visit: Payer: Self-pay

## 2021-01-17 ENCOUNTER — Ambulatory Visit (INDEPENDENT_AMBULATORY_CARE_PROVIDER_SITE_OTHER): Payer: Medicare Other | Admitting: Dermatology

## 2021-01-17 DIAGNOSIS — L578 Other skin changes due to chronic exposure to nonionizing radiation: Secondary | ICD-10-CM | POA: Diagnosis not present

## 2021-01-17 DIAGNOSIS — L820 Inflamed seborrheic keratosis: Secondary | ICD-10-CM

## 2021-01-17 DIAGNOSIS — Z1283 Encounter for screening for malignant neoplasm of skin: Secondary | ICD-10-CM | POA: Diagnosis not present

## 2021-01-17 DIAGNOSIS — D1800 Hemangioma unspecified site: Secondary | ICD-10-CM

## 2021-01-17 DIAGNOSIS — L570 Actinic keratosis: Secondary | ICD-10-CM

## 2021-01-17 DIAGNOSIS — Z8571 Personal history of Hodgkin lymphoma: Secondary | ICD-10-CM

## 2021-01-17 DIAGNOSIS — Z85820 Personal history of malignant melanoma of skin: Secondary | ICD-10-CM | POA: Diagnosis not present

## 2021-01-17 DIAGNOSIS — Z86018 Personal history of other benign neoplasm: Secondary | ICD-10-CM

## 2021-01-17 DIAGNOSIS — L814 Other melanin hyperpigmentation: Secondary | ICD-10-CM

## 2021-01-17 DIAGNOSIS — D229 Melanocytic nevi, unspecified: Secondary | ICD-10-CM

## 2021-01-17 DIAGNOSIS — L821 Other seborrheic keratosis: Secondary | ICD-10-CM

## 2021-01-17 NOTE — Progress Notes (Signed)
Follow-Up Visit   Subjective  Bill Coleman is a 75 y.o. male who presents for the following: Annual Exam (Mole check ). Hx of Melanoma on the back.  The patient presents for Total-Body Skin Exam (TBSE) for skin cancer screening and mole check.   The following portions of the chart were reviewed this encounter and updated as appropriate:   Tobacco  Allergies  Meds  Problems  Med Hx  Surg Hx  Fam Hx     Review of Systems:  No other skin or systemic complaints except as noted in HPI or Assessment and Plan.  Objective  Well appearing patient in no apparent distress; mood and affect are within normal limits.  A full examination was performed including scalp, head, eyes, ears, nose, lips, neck, chest, axillae, abdomen, back, buttocks, bilateral upper extremities, bilateral lower extremities, hands, feet, fingers, toes, fingernails, and toenails. All findings within normal limits unless otherwise noted below.  Right Ear x 1, left ear x 2 (3) Erythematous thin papules/macules with gritty scale.   Scalp x 1 Erythematous keratotic or waxy stuck-on papule or plaque.    Assessment & Plan  AK (actinic keratosis) (3) Right Ear x 1, left ear x 2  Destruction of lesion - Right Ear x 1, left ear x 2 Complexity: simple   Destruction method: cryotherapy   Informed consent: discussed and consent obtained   Timeout:  patient name, date of birth, surgical site, and procedure verified Lesion destroyed using liquid nitrogen: Yes   Region frozen until ice ball extended beyond lesion: Yes   Outcome: patient tolerated procedure well with no complications   Post-procedure details: wound care instructions given    Inflamed seborrheic keratosis Scalp x 1  Destruction of lesion - Scalp x 1 Complexity: simple   Destruction method: cryotherapy   Informed consent: discussed and consent obtained   Timeout:  patient name, date of birth, surgical site, and procedure verified Lesion destroyed  using liquid nitrogen: Yes   Region frozen until ice ball extended beyond lesion: Yes   Outcome: patient tolerated procedure well with no complications   Post-procedure details: wound care instructions given    Skin cancer screening  Lentigines - Scattered tan macules - Due to sun exposure - Benign-appearing, observe - Recommend daily broad spectrum sunscreen SPF 30+ to sun-exposed areas, reapply every 2 hours as needed. - Call for any changes  Seborrheic Keratoses - Stuck-on, waxy, tan-brown papules and/or plaques  - Benign-appearing - Discussed benign etiology and prognosis. - Observe - Call for any changes  Melanocytic Nevi - Tan-brown and/or pink-flesh-colored symmetric macules and papules - Benign appearing on exam today - Observation - Call clinic for new or changing moles - Recommend daily use of broad spectrum spf 30+ sunscreen to sun-exposed areas.   Hemangiomas - Red papules - Discussed benign nature - Observe - Call for any changes  Actinic Damage - Chronic condition, secondary to cumulative UV/sun exposure - diffuse scaly erythematous macules with underlying dyspigmentation - Recommend daily broad spectrum sunscreen SPF 30+ to sun-exposed areas, reapply every 2 hours as needed.  - Staying in the shade or wearing long sleeves, sun glasses (UVA+UVB protection) and wide brim hats (4-inch brim around the entire circumference of the hat) are also recommended for sun protection.  - Call for new or changing lesions.  History of Dysplastic Nevi Multiple see history - No evidence of recurrence today - Recommend regular full body skin exams - Recommend daily broad spectrum sunscreen SPF 30+  to sun-exposed areas, reapply every 2 hours as needed.  - Call if any new or changing lesions are noted between office visits   History of Melanoma Back  - No evidence of recurrence today - No lymphadenopathy - Recommend regular full body skin exams - Recommend daily broad  spectrum sunscreen SPF 30+ to sun-exposed areas, reapply every 2 hours as needed.  - Call if any new or changing lesions are noted between office visits   History of Hodgkin's lymphoma status postchemotherapy and radiation.  Now in remission for many many years  Skin cancer screening performed today.   Return in about 1 year (around 01/17/2022) for TBSE, hx of Melanoma .  IMarye Round, CMA, am acting as scribe for Sarina Ser, MD .  Documentation: I have reviewed the above documentation for accuracy and completeness, and I agree with the above.  Sarina Ser, MD

## 2021-01-17 NOTE — Patient Instructions (Addendum)

## 2022-01-17 ENCOUNTER — Encounter: Payer: Medicare Other | Admitting: Dermatology

## 2022-02-06 DEATH — deceased
